# Patient Record
Sex: Female | Born: 1970 | Race: Black or African American | Hispanic: No | Marital: Married | Smoking: Current every day smoker
Health system: Southern US, Community
[De-identification: ages and names within clinical notes are randomized; demographics above are authoritative.]

## PROBLEM LIST (undated history)

## (undated) DIAGNOSIS — F32A Depression, unspecified: Secondary | ICD-10-CM

## (undated) DIAGNOSIS — F419 Anxiety disorder, unspecified: Secondary | ICD-10-CM

## (undated) DIAGNOSIS — D649 Anemia, unspecified: Secondary | ICD-10-CM

## (undated) DIAGNOSIS — I10 Essential (primary) hypertension: Secondary | ICD-10-CM

## (undated) DIAGNOSIS — E119 Type 2 diabetes mellitus without complications: Secondary | ICD-10-CM

## (undated) DIAGNOSIS — F4310 Post-traumatic stress disorder, unspecified: Secondary | ICD-10-CM

## (undated) DIAGNOSIS — F3110 Bipolar disorder, current episode manic without psychotic features, unspecified: Secondary | ICD-10-CM

## (undated) DIAGNOSIS — E785 Hyperlipidemia, unspecified: Secondary | ICD-10-CM

## (undated) HISTORY — DX: Bipolar disorder, current episode manic without psychotic features, unspecified: F31.10

## (undated) HISTORY — DX: Post-traumatic stress disorder, unspecified: F43.10

## (undated) HISTORY — PX: TUBAL LIGATION: SHX77

## (undated) HISTORY — DX: Type 2 diabetes mellitus without complications: E11.9

---

## 2013-02-24 ENCOUNTER — Encounter (HOSPITAL_COMMUNITY): Payer: Self-pay | Admitting: Emergency Medicine

## 2013-02-24 ENCOUNTER — Emergency Department (HOSPITAL_COMMUNITY): Payer: Medicaid Other

## 2013-02-24 ENCOUNTER — Emergency Department (HOSPITAL_COMMUNITY)
Admission: EM | Admit: 2013-02-24 | Discharge: 2013-02-24 | Disposition: A | Discharge status: Home / Self Care | Payer: Medicaid Other | Attending: Emergency Medicine | Admitting: Emergency Medicine

## 2013-02-24 DIAGNOSIS — R112 Nausea with vomiting, unspecified: Secondary | ICD-10-CM | POA: Insufficient documentation

## 2013-02-24 DIAGNOSIS — K5900 Constipation, unspecified: Secondary | ICD-10-CM | POA: Insufficient documentation

## 2013-02-24 DIAGNOSIS — Z9851 Tubal ligation status: Secondary | ICD-10-CM | POA: Insufficient documentation

## 2013-02-24 DIAGNOSIS — Z3202 Encounter for pregnancy test, result negative: Secondary | ICD-10-CM | POA: Insufficient documentation

## 2013-02-24 DIAGNOSIS — Z79899 Other long term (current) drug therapy: Secondary | ICD-10-CM | POA: Insufficient documentation

## 2013-02-24 DIAGNOSIS — R1084 Generalized abdominal pain: Secondary | ICD-10-CM | POA: Insufficient documentation

## 2013-02-24 DIAGNOSIS — K219 Gastro-esophageal reflux disease without esophagitis: Secondary | ICD-10-CM | POA: Insufficient documentation

## 2013-02-24 LAB — COMPREHENSIVE METABOLIC PANEL
ALT: 11 U/L (ref 0–35)
AST: 13 U/L (ref 0–37)
Alkaline Phosphatase: 88 U/L (ref 39–117)
CO2: 21 mEq/L (ref 19–32)
Calcium: 8.8 mg/dL (ref 8.4–10.5)
Chloride: 107 mEq/L (ref 96–112)
GFR calc Af Amer: >90 mL/min (ref >90)
GFR calc non Af Amer: >90 mL/min (ref >90)
Glucose, Bld: 100 mg/dL — ABNORMAL HIGH (ref 70–99)
Sodium: 139 mEq/L (ref 135–145)
Total Bilirubin: 0.2 mg/dL — ABNORMAL LOW (ref 0.3–1.2)

## 2013-02-24 LAB — CBC WITH DIFFERENTIAL/PLATELET
Basophils Absolute: 0 10*3/uL (ref 0.0–0.1)
Eosinophils Relative: 3 % (ref 0–5)
HCT: 36.4 % (ref 36.0–46.0)
Lymphocytes Relative: 43 % (ref 12–46)
Lymphs Abs: 3.8 10*3/uL (ref 0.7–4.0)
MCV: 84.1 fL (ref 78.0–100.0)
Neutro Abs: 4.2 10*3/uL (ref 1.7–7.7)
Platelets: 439 10*3/uL — ABNORMAL HIGH (ref 150–400)
RBC: 4.33 MIL/uL (ref 3.87–5.11)
RDW: 16.3 % — ABNORMAL HIGH (ref 11.5–15.5)
WBC: 8.7 10*3/uL (ref 4.0–10.5)

## 2013-02-24 LAB — URINALYSIS, ROUTINE W REFLEX MICROSCOPIC
Bilirubin Urine: NEGATIVE
Glucose, UA: NEGATIVE mg/dL
Hgb urine dipstick: NEGATIVE
Ketones, ur: NEGATIVE mg/dL
Protein, ur: NEGATIVE mg/dL
Urobilinogen, UA: 1 mg/dL (ref 0.0–1.0)

## 2013-02-24 LAB — PREGNANCY, URINE: Preg Test, Ur: NEGATIVE

## 2013-02-24 MED ORDER — OMEPRAZOLE 20 MG PO CPDR: 20.0000 mg | DELAYED_RELEASE_CAPSULE | Freq: Every day | ORAL | Status: DC

## 2013-02-24 MED ORDER — ONDANSETRON HCL 4 MG/2ML IJ SOLN
4.0000 mg | Freq: Once | INTRAMUSCULAR | Status: AC
  Administered 2013-02-24: 4 mg via INTRAVENOUS
  Filled 2013-02-24: qty 2

## 2013-02-24 MED ORDER — PROMETHAZINE HCL 25 MG PO TABS: 25.0000 mg | ORAL_TABLET | Freq: Four times a day (QID) | ORAL | Status: DC | PRN

## 2013-02-24 MED ORDER — KETOROLAC TROMETHAMINE 30 MG/ML IJ SOLN
30.0000 mg | Freq: Once | INTRAMUSCULAR | Status: AC
  Administered 2013-02-24: 30 mg via INTRAVENOUS
  Filled 2013-02-24: qty 1

## 2013-02-24 MED ORDER — SODIUM CHLORIDE 0.9 % IV BOLUS (SEPSIS)
1000.0000 mL | Freq: Once | INTRAVENOUS | Status: AC
  Administered 2013-02-24: 1000 mL via INTRAVENOUS

## 2013-02-24 MED ORDER — GI COCKTAIL ~~LOC~~
30.0000 mL | Freq: Once | ORAL | Status: AC
  Administered 2013-02-24: 30 mL via ORAL
  Filled 2013-02-24: qty 30

## 2013-02-24 MED ORDER — FLEET ENEMA 7-19 GM/118ML RE ENEM
1.0000 | ENEMA | Freq: Once | RECTAL | Status: AC
  Administered 2013-02-24: 1 via RECTAL
  Filled 2013-02-24: qty 1

## 2013-02-24 NOTE — ED Provider Notes (Signed)
History     CSN: [id number]  Arrival date & time 02/24/13  0941   First MD Initiated Contact with Patient 02/24/13 0945      Chief Complaint  Patient presents with  . Abdominal Pain  . Gastrophageal Reflux    (Consider location/radiation/quality/duration/timing/severity/associated sxs/prior treatment) The history is provided by the patient.  Meris Reede is a 42 y.o. female hx of reflux here with ab pain. Diffuse abdominal pain worse in the epigastric area for the last 2 days. It's crampy and intermittent and worse with food and laying down. She said it feels like her reflux but she took some Pepcid without any relief. She never had endoscopy in the past. Has some nausea and several episodes of vomiting and now is afraid to eat. She's been constipated for 3 days and had only hard stool. No urinary symptoms and denies being pregnant. She has a history of tubal ligation in the past.   History reviewed. No pertinent past medical history.  History reviewed. No pertinent past surgical history.  No family history on file.  History  Substance Use Topics  . Smoking status: Not on file  . Smokeless tobacco: Not on file  . Alcohol Use: Not on file    OB History   Grav Para Term Preterm Abortions TAB SAB Ect Mult Living                  Review of Systems  Gastrointestinal: Positive for nausea, vomiting, abdominal pain and constipation.  All other systems reviewed and are negative.    Allergies  Review of patient's allergies indicates no known allergies.  Home Medications   Current Outpatient Rx  Name  Route  Sig  Dispense  Refill  . famotidine (PEPCID) 20 MG tablet   Oral   Take 20 mg by mouth 2 (two) times daily.           BP 137/68  Pulse 83  Temp(Src) 98.9 F (37.2 C) (Oral)  Resp 20  SpO2 93%  LMP 02/18/2013  Physical Exam  Nursing note and vitals reviewed. Constitutional: She is oriented to person, place, and time. She appears well-developed  and well-nourished.  Uncomfortable   HENT:  Head: Normocephalic.  MM slightly dry   Eyes: Conjunctivae are normal. Pupils are equal, round, and reactive to light.  Neck: Normal range of motion. Neck supple.  Cardiovascular: Normal rate, regular rhythm and normal heart sounds.   Pulmonary/Chest: Effort normal and breath sounds normal. No respiratory distress. She has no wheezes. She has no rales.  Abdominal: Soft.  Mild diffuse tenderness, no rebound. Mild R CVAT. Rectal- minimal stool at vault, not impacted   Musculoskeletal: Normal range of motion. She exhibits no edema and no tenderness.  Neurological: She is alert and oriented to person, place, and time.  Skin: Skin is warm and dry.  Psychiatric: She has a normal mood and affect. Her behavior is normal. Judgment and thought content normal.    ED Course  Procedures (including critical care time)  Labs Reviewed  CBC WITH DIFFERENTIAL - Abnormal; Notable for the following:    RDW 16.3 (*)    Platelets 439 (*)    All other components within normal limits  COMPREHENSIVE METABOLIC PANEL - Abnormal; Notable for the following:    Glucose, Bld 100 (*)    Total Bilirubin 0.2 (*)    All other components within normal limits  URINALYSIS, ROUTINE W REFLEX MICROSCOPIC - Abnormal; Notable for the following:  Specific Gravity, Urine 1.035 (*)    All other components within normal limits  LIPASE, BLOOD  PREGNANCY, URINE  TROPONIN I   Dg Abd Acute W/chest  02/24/2013  *RADIOLOGY REPORT*  Clinical Data: Abdominal pain.  Gastroesophageal reflux.  Nausea. Pain radiating into the shoulder.  ACUTE ABDOMEN SERIES (ABDOMEN 2 VIEW & CHEST 1 VIEW)  Comparison: None.  Findings:  Cardiopericardial silhouette within normal limits. Mediastinal contours normal. Trachea midline.  No airspace disease or effusion.  No free air underneath the hemidiaphragm.  Normal bowel gas pattern is present.  Moderate to large stool burden is present.  Stool and bowel gas  present in the region of the rectosigmoid. Phleboliths noted.  IMPRESSION: Nonobstructive bowel gas pattern with moderate to large stool burden.   Original Report Authenticated By: Andreas Newport, M.D.      No diagnosis found.   Date: 02/24/2013  Rate: 66  Rhythm: normal sinus rhythm  QRS Axis: normal  Intervals: normal  ST/T Wave abnormalities: normal  Conduction Disutrbances:none  Narrative Interpretation:   Old EKG Reviewed: none available    MDM  Chaya Dollar-Bailey is a 42 y.o. female here with ab pain, vomiting. Will need to r/o pancreatitis vs worsening GERD vs SBO vs pyelo. Low risk for ACS and symptoms for several days so will get screening EKG and trop x 1. Will get labs, xray abd, give pain meds and reassess.    11:49 AM Xrays showed constipation. Lipase, UA nl. Patient hydrated and given zofran. Also given enema and had small bowel movement. I think her symptoms are likely from constipation causing worsening reflux. Will put her on prilosec in addition to pepcid. Recommend miralax for constipation and GI f/u.        Richardean Canal, MD 02/24/13 1150

## 2013-02-24 NOTE — ED Notes (Signed)
Patient transported to X-ray 

## 2013-02-24 NOTE — ED Notes (Signed)
Pt c/o of abd pain x2 days. Also c/o of GERD, hx of. Denies v/d.

## 2013-05-10 ENCOUNTER — Emergency Department (HOSPITAL_COMMUNITY): Payer: Medicaid Other

## 2013-05-10 ENCOUNTER — Encounter (HOSPITAL_COMMUNITY): Payer: Self-pay | Admitting: Emergency Medicine

## 2013-05-10 ENCOUNTER — Emergency Department (HOSPITAL_COMMUNITY)
Admission: EM | Admit: 2013-05-10 | Discharge: 2013-05-10 | Disposition: A | Discharge status: Home / Self Care | Payer: Medicaid Other | Attending: Emergency Medicine | Admitting: Emergency Medicine

## 2013-05-10 DIAGNOSIS — M79671 Pain in right foot: Secondary | ICD-10-CM

## 2013-05-10 DIAGNOSIS — M79609 Pain in unspecified limb: Secondary | ICD-10-CM | POA: Insufficient documentation

## 2013-05-10 MED ORDER — TRAMADOL HCL 50 MG PO TABS: 50.0000 mg | ORAL_TABLET | Freq: Four times a day (QID) | ORAL | Status: DC | PRN

## 2013-05-10 MED ORDER — TRAMADOL HCL 50 MG PO TABS
50.0000 mg | ORAL_TABLET | Freq: Once | ORAL | Status: AC
  Administered 2013-05-10: 50 mg via ORAL
  Filled 2013-05-10: qty 1

## 2013-05-10 NOTE — ED Provider Notes (Signed)
This chart was scribed for Danne Harbor, a non-physician practitioner working with Juliet Rude. Rubin Payor, MD by Lewanda Rife, ED Scribe. This patient was seen in room TR08C/TR08C and the patient's care was started at 1735.    History    CSN: [id number] Arrival date & time 05/10/13  1419  First MD Initiated Contact with Patient 05/10/13 1722     Chief Complaint  Patient presents with  . Foot Pain  . Knee Pain   (Consider location/radiation/quality/duration/timing/severity/associated sxs/prior Treatment) The history is provided by the patient.   HPI Comments: Stacy Cooper is a 42 y.o. female who presents to the Emergency Department complaining of constant worsening right foot pain on heel and medial aspect onset 1 year. Describes pain as non-radiating. Denies associated injury, any other pain, and swelling. Reports pain is aggravated after walking for a long period of time and alleviated at rest. Denies taking any pain medication PTA to relieve symptoms. Reports seeing a PCP with dx of plantar fasciitis. Reports having podiatrist appointment tomorrow.   History reviewed. No pertinent past medical history. History reviewed. No pertinent past surgical history. History reviewed. No pertinent family history. History  Substance Use Topics  . Smoking status: Not on file  . Smokeless tobacco: Not on file  . Alcohol Use: Not on file   OB History   Grav Para Term Preterm Abortions TAB SAB Ect Mult Living                 Review of Systems  Constitutional: Negative for fever.  Musculoskeletal: Positive for myalgias (right foot pain ).  Psychiatric/Behavioral: Negative for confusion.  All other systems reviewed and are negative.    Allergies  Prednisone  Home Medications   Current Outpatient Rx  Name  Route  Sig  Dispense  Refill  . omeprazole (PRILOSEC) 20 MG capsule   Oral   Take 1 capsule (20 mg total) by mouth daily.   20 capsule   0    BP 134/74   Pulse 96  Temp(Src) 98.4 F (36.9 C) (Oral)  Resp 18  SpO2 100% Physical Exam  Nursing note and vitals reviewed. Constitutional: She is oriented to person, place, and time. She appears well-developed and well-nourished. No distress.  HENT:  Head: Normocephalic and atraumatic.  Eyes: EOM are normal.  Neck: Neck supple. No tracheal deviation present.  Cardiovascular: Intact distal pulses.   Pulses:      Dorsalis pedis pulses are 2+ on the right side.  Pulmonary/Chest: Effort normal. No respiratory distress.  Musculoskeletal: Normal range of motion. She exhibits tenderness.  Tenderness to right heel and medial aspect  Right achilles tendon tenderness without laxity    Neurological: She is alert and oriented to person, place, and time.  Skin: Skin is warm and dry.  Psychiatric: She has a normal mood and affect. Her behavior is normal.    ED Course  Procedures (including critical care time) Medications  traMADol (ULTRAM) tablet 50 mg (50 mg Oral Given 05/10/13 1759)  Dg Foot Complete Right  05/10/2013   *RADIOLOGY REPORT*  Clinical Data: Foot pain, ankle pain, no known injury  RIGHT FOOT COMPLETE - 3+ VIEW  Comparison: None.  Findings: Three views of the right foot submitted.  No acute fracture or subluxation.  There is small plantar and posterior spur of the calcaneus.  IMPRESSION: No acute fracture or subluxation.  Small plantar and posterior spur of the calcaneus.   Original Report Authenticated By: Natasha Mead, M.D.  Labs Reviewed - No data to display No results found. No diagnosis found. 1. Right foot pain MDM  Uncomplicated foot pain without objective finding.     I personally performed the services described in this documentation, which was scribed in my presence. The recorded information has been reviewed and is accurate.     Arnoldo Hooker, PA-C 05/10/13 1828

## 2013-05-10 NOTE — ED Notes (Signed)
Pt c/o right foot pain and right knee pain x several weeks; pt sts seen PCP and told needs to see podiatrist

## 2013-05-11 NOTE — ED Provider Notes (Signed)
Medical screening examination/treatment/procedure(s) were performed by non-physician practitioner and as supervising physician I was immediately available for consultation/collaboration.  Gail Creekmore R. Bruin Bolger, MD 05/11/13 0017 

## 2013-10-18 ENCOUNTER — Encounter (HOSPITAL_COMMUNITY): Payer: Self-pay | Admitting: Emergency Medicine

## 2013-10-18 ENCOUNTER — Emergency Department (HOSPITAL_COMMUNITY)
Admission: EM | Admit: 2013-10-18 | Discharge: 2013-10-18 | Disposition: A | Discharge status: Home / Self Care | Payer: Medicaid Other | Attending: Emergency Medicine | Admitting: Emergency Medicine

## 2013-10-18 DIAGNOSIS — Z888 Allergy status to other drugs, medicaments and biological substances status: Secondary | ICD-10-CM | POA: Insufficient documentation

## 2013-10-18 DIAGNOSIS — M436 Torticollis: Secondary | ICD-10-CM | POA: Insufficient documentation

## 2013-10-18 DIAGNOSIS — G243 Spasmodic torticollis: Secondary | ICD-10-CM

## 2013-10-18 DIAGNOSIS — M25519 Pain in unspecified shoulder: Secondary | ICD-10-CM | POA: Insufficient documentation

## 2013-10-18 DIAGNOSIS — IMO0001 Reserved for inherently not codable concepts without codable children: Secondary | ICD-10-CM | POA: Insufficient documentation

## 2013-10-18 DIAGNOSIS — M79609 Pain in unspecified limb: Secondary | ICD-10-CM | POA: Insufficient documentation

## 2013-10-18 DIAGNOSIS — Z79899 Other long term (current) drug therapy: Secondary | ICD-10-CM | POA: Insufficient documentation

## 2013-10-18 DIAGNOSIS — F17200 Nicotine dependence, unspecified, uncomplicated: Secondary | ICD-10-CM | POA: Insufficient documentation

## 2013-10-18 MED ORDER — DIAZEPAM 5 MG PO TABS
5.0000 mg | ORAL_TABLET | Freq: Once | ORAL | Status: AC
Start: 2013-10-18 — End: 2013-10-18
  Administered 2013-10-18: 5 mg via ORAL
  Filled 2013-10-18: qty 1

## 2013-10-18 MED ORDER — DIAZEPAM 5 MG PO TABS: 5.0000 mg | ORAL_TABLET | Freq: Two times a day (BID) | ORAL | Status: DC

## 2013-10-18 MED ORDER — ACETAMINOPHEN 325 MG PO TABS
650.0000 mg | ORAL_TABLET | Freq: Once | ORAL | Status: AC
  Administered 2013-10-18: 650 mg via ORAL
  Filled 2013-10-18: qty 2

## 2013-10-18 MED ORDER — IBUPROFEN 800 MG PO TABS: 800.0000 mg | ORAL_TABLET | Freq: Three times a day (TID) | ORAL | Status: DC

## 2013-10-18 NOTE — ED Provider Notes (Signed)
CSN: [id number]     Arrival date & time 10/18/13  1957 History  This chart was scribed for non-physician practitioner, Francee Piccolo, PA-C working with Junius Argyle, MD, by Andrew Au, ED Scribe. This patient was seen in room TR05C/TR05C and the patient's care was started at 10:31 PM.     Chief Complaint  Patient presents with  . Neck Pain  . Arm Pain  . Shoulder Pain    The history is provided by the patient. No language interpreter was used.   HPI Comments: Stacy Cooper is a 42 y.o. female who presents to the Emergency Department complaining of 1.5 days of constant, gradually worsening neck pain that radiates to down the left arm. She describes this pain as "nagging" and rates this pain as 10/10. She denies any recent falls or injuries. She has taken 4 aleve without relief. She states any movements worsen this pain. She denies fever, visual disturbances.   History reviewed. No pertinent past medical history. History reviewed. No pertinent past surgical history. No family history on file. History  Substance Use Topics  . Smoking status: Current Every Day Smoker  . Smokeless tobacco: Not on file  . Alcohol Use: No   OB History   Grav Para Term Preterm Abortions TAB SAB Ect Mult Living                 Review of Systems  Constitutional: Negative for fever and chills.  Eyes: Negative for visual disturbance.  Respiratory: Negative for shortness of breath.   Cardiovascular: Negative for chest pain.  Musculoskeletal: Positive for arthralgias ( left shoulder and arm pain) and myalgias. Negative for back pain and neck stiffness.  Skin: Negative.   Neurological: Negative for syncope and headaches.    Allergies  Shellfish allergy and Prednisone  Home Medications   Current Outpatient Rx  Name  Route  Sig  Dispense  Refill  . naproxen sodium (ANAPROX) 220 MG tablet   Oral   Take 440 mg by mouth 2 (two) times daily with a meal.         . diazepam  (VALIUM) 5 MG tablet   Oral   Take 1 tablet (5 mg total) by mouth 2 (two) times daily.   10 tablet   0   . ibuprofen (ADVIL,MOTRIN) 800 MG tablet   Oral   Take 1 tablet (800 mg total) by mouth 3 (three) times daily.   21 tablet   0    BP 129/47  Pulse 72  Temp(Src) 99.3 F (37.4 C) (Oral)  Resp 18  SpO2 99%  LMP 09/27/2013 Physical Exam  Nursing note and vitals reviewed. Constitutional: She is oriented to person, place, and time. She appears well-developed and well-nourished. No distress.  HENT:  Head: Normocephalic and atraumatic.  Right Ear: External ear normal.  Left Ear: External ear normal.  Nose: Nose normal.  Mouth/Throat: Oropharynx is clear and moist. No oropharyngeal exudate.  Eyes: Conjunctivae and EOM are normal. Pupils are equal, round, and reactive to light.  Neck: Normal range of motion. Neck supple. Muscular tenderness present. No spinous process tenderness present. No rigidity. No edema, no erythema and normal range of motion present.    Cardiovascular: Normal rate, regular rhythm, normal heart sounds and intact distal pulses.   Pulmonary/Chest: Effort normal and breath sounds normal. No respiratory distress.  Abdominal: Soft. There is no tenderness.  Musculoskeletal: Normal range of motion.       Right shoulder: Normal.  Left shoulder: Normal.  Neurological: She is alert and oriented to person, place, and time. She has normal strength. No cranial nerve deficit or sensory deficit. Gait normal. GCS eye subscore is 4. GCS verbal subscore is 5. GCS motor subscore is 6.  No pronator drift. Bilateral heel-knee-shin intact.  Skin: Skin is warm and dry. She is not diaphoretic.  Psychiatric: She has a normal mood and affect.    ED Course  Procedures  DIAGNOSTIC STUDIES: Oxygen Saturation is 99% on RA, normal by my interpretation.    COORDINATION OF CARE: 10:00 PM-Will prescribe motrin and valium. Discussed treatment plan with pt at bedside and pt  agreed to plan.   Medications  acetaminophen (TYLENOL) tablet 650 mg (650 mg Oral Given 10/18/13 2145)  diazepam (VALIUM) tablet 5 mg (5 mg Oral Given 10/18/13 2255)    Labs Review Labs Reviewed - No data to display Imaging Review No results found.  EKG Interpretation   None       MDM   1. Torticollis, spasmodic     Patient did not meet NEXUS C-spine x-ray criteria. The patient had no posterior midline C-spine tenderness. Patient had no evidence of intoxication. Patient had normal level of altertness with GSC >14. Patient had no complaint or physical exam finding for focal neurological deficit. Patient had no distracting injury.    Afebrile, NAD, non-toxic appearing, AAOx4. No neurofocal deficits. No cervical spinous process tenderness. Pt w/ left sided cervical muscle spasm. Will treat with muscle relaxants. Return precautions discussed. Patient is agreeable to plan. Patient is stable at time of discharge      I personally performed the services described in this documentation, which was scribed in my presence. The recorded information has been reviewed and is accurate.    Jeannetta Ellis, PA-C 10/18/13 2341

## 2013-10-18 NOTE — ED Notes (Signed)
Discharge instructions reviewed with pt. Pt verbalized understanding.   

## 2013-10-18 NOTE — ED Notes (Signed)
Pt c/o left sided neck, shoulder and arm pain that started yesterday, went away and then came back today. Pt denies any injury, states she has never had pain like this before. Pt rates pain 10/10. Pt states she has had 4 aleve earlier today with no relief.

## 2013-10-18 NOTE — ED Notes (Signed)
C/o L neck pain, also L shoulder and arm pain, onset yesterday, no relief with aleve, took 2 aleve at 1400 and 2 aleve at 1600. No h/o same, worse with movement, CMS intact. Guarding movements. Alert, NAD, calm, ambulatory with slow steady careful gait. Looks uncomfortable. LS CTA. No recent travel.

## 2013-10-19 NOTE — ED Provider Notes (Signed)
Medical screening examination/treatment/procedure(s) were performed by non-physician practitioner and as supervising physician I was immediately available for consultation/collaboration.  EKG Interpretation   None         Junius Argyle, MD 10/19/13 1314

## 2015-02-23 ENCOUNTER — Other Ambulatory Visit: Payer: Self-pay | Admitting: Internal Medicine

## 2015-02-23 DIAGNOSIS — N920 Excessive and frequent menstruation with regular cycle: Secondary | ICD-10-CM

## 2015-02-23 DIAGNOSIS — N946 Dysmenorrhea, unspecified: Secondary | ICD-10-CM

## 2015-02-28 ENCOUNTER — Other Ambulatory Visit: Payer: Medicaid Other

## 2015-03-06 ENCOUNTER — Other Ambulatory Visit: Payer: Medicaid Other

## 2015-03-06 ENCOUNTER — Inpatient Hospital Stay: Admission: RE | Admit: 2015-03-06 | Payer: Medicaid Other | Source: Ambulatory Visit

## 2015-04-12 ENCOUNTER — Other Ambulatory Visit: Payer: Medicaid Other

## 2015-10-18 ENCOUNTER — Emergency Department (HOSPITAL_COMMUNITY)
Admission: EM | Admit: 2015-10-18 | Discharge: 2015-10-18 | Disposition: A | Discharge status: Home / Self Care | Payer: Medicaid Other | Attending: Emergency Medicine | Admitting: Emergency Medicine

## 2015-10-18 ENCOUNTER — Encounter (HOSPITAL_COMMUNITY): Payer: Self-pay

## 2015-10-18 ENCOUNTER — Emergency Department (HOSPITAL_COMMUNITY): Payer: Medicaid Other

## 2015-10-18 DIAGNOSIS — J3489 Other specified disorders of nose and nasal sinuses: Secondary | ICD-10-CM | POA: Insufficient documentation

## 2015-10-18 DIAGNOSIS — Z791 Long term (current) use of non-steroidal anti-inflammatories (NSAID): Secondary | ICD-10-CM | POA: Diagnosis not present

## 2015-10-18 DIAGNOSIS — Z79899 Other long term (current) drug therapy: Secondary | ICD-10-CM | POA: Insufficient documentation

## 2015-10-18 DIAGNOSIS — F17210 Nicotine dependence, cigarettes, uncomplicated: Secondary | ICD-10-CM | POA: Diagnosis not present

## 2015-10-18 DIAGNOSIS — R05 Cough: Secondary | ICD-10-CM | POA: Diagnosis not present

## 2015-10-18 DIAGNOSIS — R059 Cough, unspecified: Secondary | ICD-10-CM

## 2015-10-18 MED ORDER — AZITHROMYCIN 250 MG PO TABS: 250.0000 mg | ORAL_TABLET | Freq: Every day | ORAL | Status: DC

## 2015-10-18 NOTE — ED Notes (Signed)
Pt c/o increasing cough, mid back pain w/ coughing, and congestion x 2 weeks.  Pain score 7/10.  Pt reports taking OTC medications w/o relief.

## 2015-10-18 NOTE — Discharge Instructions (Signed)

## 2015-10-18 NOTE — ED Provider Notes (Signed)
CSN: [id number]     Arrival date & time 10/18/15  1012 History   First MD Initiated Contact with Patient 10/18/15 1101     Chief Complaint  Patient presents with  . Cough  . Nasal Congestion     (Consider location/radiation/quality/duration/timing/severity/associated sxs/prior Treatment) HPI Comments: Pt c/o cough and congestion for 2 weeks. No fever. She states that she is now having some back pain from coughing. Has been taking mucinex without relief. No neck pain or stiffness. No nausea, vomiting or diarrhea.  The history is provided by the patient. No language interpreter was used.    History reviewed. No pertinent past medical history. Past Surgical History  Procedure Laterality Date  . Cesarean section    . Tubal ligation     History reviewed. No pertinent family history. Social History  Substance Use Topics  . Smoking status: Current Every Day Smoker -- 0.25 packs/day    Types: Cigarettes  . Smokeless tobacco: None  . Alcohol Use: No   OB History    No data available     Review of Systems  All other systems reviewed and are negative.     Allergies  Shellfish allergy and Prednisone  Home Medications   Prior to Admission medications   Medication Sig Start Date End Date Taking? Authorizing Provider  diazepam (VALIUM) 5 MG tablet Take 1 tablet (5 mg total) by mouth 2 (two) times daily. 10/18/13   Jennifer Piepenbrink, PA-C  ibuprofen (ADVIL,MOTRIN) 800 MG tablet Take 1 tablet (800 mg total) by mouth 3 (three) times daily. 10/18/13   Jennifer Piepenbrink, PA-C  naproxen sodium (ANAPROX) 220 MG tablet Take 440 mg by mouth 2 (two) times daily with a meal.    Historical Provider, MD   BP 99/87 mmHg  Pulse 65  Temp(Src) 98.2 F (36.8 C) (Oral)  Resp 16  SpO2 100%  LMP 10/11/2015 Physical Exam  Constitutional: She is oriented to person, place, and time. She appears well-developed and well-nourished.  HENT:  Right Ear: External ear normal.  Left Ear: External  ear normal.  Nose: Rhinorrhea present.  Cardiovascular: Normal rate and regular rhythm.   Pulmonary/Chest: Effort normal and breath sounds normal.  Abdominal: Soft. Bowel sounds are normal. There is no tenderness.  Musculoskeletal: Normal range of motion.  Neurological: She is alert and oriented to person, place, and time.  Skin: Skin is warm.  Psychiatric: She has a normal mood and affect.  Nursing note and vitals reviewed.   ED Course  Procedures (including critical care time) Labs Review Labs Reviewed - No data to display  Imaging Review Dg Chest 2 View  10/18/2015  CLINICAL DATA:  Cough and congestion EXAM: CHEST  2 VIEW COMPARISON:  02/24/2013 FINDINGS: The heart size and mediastinal contours are within normal limits. Both lungs are clear. The visualized skeletal structures are unremarkable. IMPRESSION: No active cardiopulmonary disease. Electronically Signed   By: Marlan Palauharles  Clark M.D.   On: 10/18/2015 11:40   I have personally reviewed and evaluated these images and lab results as part of my medical decision-making.   EKG Interpretation None      MDM   Final diagnoses:  Cough    No pneumonia noted on x-ray. Will treat pt with zithromax as having continued symptoms    Teressa LowerVrinda Abbye Lao, NP 10/18/15 1148  Mancel BaleElliott Wentz, MD 10/18/15 [phone]

## 2016-08-22 LAB — GLUCOSE, POCT (MANUAL RESULT ENTRY): POC Glucose: 168 mg/dl — AB (ref 70–99)

## 2016-09-19 NOTE — Congregational Nurse Program (Signed)
Congregational Nurse Program Note  Date of Encounter: 08/22/2016  Past Medical History: No past medical history on file.  Encounter Details:     CNP Questionnaire - 08/22/16 2344      Patient Demographics   Is this a new or existing patient? New   Patient is considered a/an Not Applicable   Race African-American/Black     Patient Assistance   Location of Patient [facility]   Patient's financial/insurance status Medicaid   Uninsured Patient ([place] [facility]/[employer]) No   Patient referred to apply for the following financial assistance Not Applicable   Food insecurities addressed Not Applicable   Transportation assistance No   Assistance securing medications No   Educational health offerings Diabetes     Encounter Details   Primary purpose of visit Acute Illness/Condition Visit;Education/Health Concerns   Was an Emergency Department visit averted? No   Does patient have a medical provider? Yes   Patient referred to Follow up with established PCP   Was a mental health screening completed? (GAINS tool) No   Does patient have dental issues? No   Does patient have vision issues? No   Does your patient have an abnormal blood pressure today? No   Since previous encounter, have you referred patient for abnormal blood pressure that resulted in a new diagnosis or medication change? No   Does your patient have an abnormal blood glucose today? Yes   Since previous encounter, have you referred patient for abnormal blood glucose that resulted in a new diagnosis or medication change? No   Was there a life-saving intervention made? No      Client is diabetic and on meds.  She had blood sugar of 168 and reported she only had coffee.  She will return for bs next week.

## 2016-09-19 NOTE — Congregational Nurse Program (Signed)
Congregational Nurse Program Note  Date of Encounter: 09/05/2016  Past Medical History: No past medical history on file.  Encounter Details:     CNP Questionnaire - 09/05/16 2358      Patient Demographics   Is this a new or existing patient? Existing   Patient is considered a/an Not Applicable   Race African-American/Black     Patient Assistance   Location of Patient [facility]   Patient's financial/insurance status Medicaid   Uninsured Patient ([place] [facility]/[employer]s) No   Patient referred to apply for the following financial assistance Not Applicable   Food insecurities addressed Not Applicable   Transportation assistance No   Assistance securing medications No   Educational health offerings Diabetes     Encounter Details   Primary purpose of visit Acute Illness/Condition Visit;Education/Health Concerns   Was an Emergency Department visit averted? No   Does patient have a medical provider? Yes   Patient referred to Follow up with established PCP   Was a mental health screening completed? (GAINS tool) No   Does patient have dental issues? No   Does patient have vision issues? No   Does your patient have an abnormal blood pressure today? No   Since previous encounter, have you referred patient for abnormal blood pressure that resulted in a new diagnosis or medication change? No   Does your patient have an abnormal blood glucose today? Yes   Since previous encounter, have you referred patient for abnormal blood glucose that resulted in a new diagnosis or medication change? No   Was there a life-saving intervention made? No      Client is diabetic and on meds.  She had blood sugar of 140 today and reported she only had coffee this am.   She will return for bs next week.

## 2017-10-23 ENCOUNTER — Emergency Department (HOSPITAL_COMMUNITY): Payer: Self-pay

## 2017-10-23 ENCOUNTER — Encounter (HOSPITAL_COMMUNITY): Payer: Self-pay

## 2017-10-23 ENCOUNTER — Emergency Department (HOSPITAL_COMMUNITY)
Admission: EM | Admit: 2017-10-23 | Discharge: 2017-10-24 | Disposition: A | Discharge status: Home / Self Care | Payer: Self-pay | Attending: Emergency Medicine | Admitting: Emergency Medicine

## 2017-10-23 DIAGNOSIS — Z79899 Other long term (current) drug therapy: Secondary | ICD-10-CM | POA: Insufficient documentation

## 2017-10-23 DIAGNOSIS — Y999 Unspecified external cause status: Secondary | ICD-10-CM | POA: Insufficient documentation

## 2017-10-23 DIAGNOSIS — F17210 Nicotine dependence, cigarettes, uncomplicated: Secondary | ICD-10-CM | POA: Insufficient documentation

## 2017-10-23 DIAGNOSIS — Y929 Unspecified place or not applicable: Secondary | ICD-10-CM | POA: Insufficient documentation

## 2017-10-23 DIAGNOSIS — X31XXXA Exposure to excessive natural cold, initial encounter: Secondary | ICD-10-CM | POA: Insufficient documentation

## 2017-10-23 DIAGNOSIS — R1084 Generalized abdominal pain: Secondary | ICD-10-CM | POA: Insufficient documentation

## 2017-10-23 DIAGNOSIS — S60511A Abrasion of right hand, initial encounter: Secondary | ICD-10-CM | POA: Insufficient documentation

## 2017-10-23 DIAGNOSIS — Y93H1 Activity, digging, shoveling and raking: Secondary | ICD-10-CM | POA: Insufficient documentation

## 2017-10-23 DIAGNOSIS — L089 Local infection of the skin and subcutaneous tissue, unspecified: Secondary | ICD-10-CM | POA: Insufficient documentation

## 2017-10-23 DIAGNOSIS — T33521A Superficial frostbite of right hand, initial encounter: Secondary | ICD-10-CM | POA: Insufficient documentation

## 2017-10-23 DIAGNOSIS — T33522A Superficial frostbite of left hand, initial encounter: Secondary | ICD-10-CM | POA: Insufficient documentation

## 2017-10-23 HISTORY — DX: Anemia, unspecified: D64.9

## 2017-10-23 LAB — COMPREHENSIVE METABOLIC PANEL
ALBUMIN: 4.1 g/dL (ref 3.5–5.0)
ALT: 12 U/L — ABNORMAL LOW (ref 14–54)
ANION GAP: 10 (ref 5–15)
AST: 17 U/L (ref 15–41)
Alkaline Phosphatase: 82 U/L (ref 38–126)
BILIRUBIN TOTAL: 0.7 mg/dL (ref 0.3–1.2)
BUN: 12 mg/dL (ref 6–20)
CHLORIDE: 106 mmol/L (ref 101–111)
CO2: 20 mmol/L — AB (ref 22–32)
Calcium: 9.6 mg/dL (ref 8.9–10.3)
Creatinine, Ser: 0.71 mg/dL (ref 0.44–1.00)
GFR calc Af Amer: >60 mL/min (ref >60)
GFR calc non Af Amer: >60 mL/min (ref >60)
GLUCOSE: 92 mg/dL (ref 65–99)
POTASSIUM: 3.9 mmol/L (ref 3.5–5.1)
SODIUM: 136 mmol/L (ref 135–145)
Total Protein: 7.4 g/dL (ref 6.5–8.1)

## 2017-10-23 LAB — CBC
HEMATOCRIT: 35.5 % — AB (ref 36.0–46.0)
HEMOGLOBIN: 11.8 g/dL — AB (ref 12.0–15.0)
MCH: 28.9 pg (ref 26.0–34.0)
MCHC: 33.2 g/dL (ref 30.0–36.0)
MCV: 86.8 fL (ref 78.0–100.0)
Platelets: 373 10*3/uL (ref 150–400)
RBC: 4.09 MIL/uL (ref 3.87–5.11)
RDW: 15.6 % — AB (ref 11.5–15.5)
WBC: 7.5 10*3/uL (ref 4.0–10.5)

## 2017-10-23 LAB — I-STAT BETA HCG BLOOD, ED (MC, WL, AP ONLY): I-stat hCG, quantitative: <5 m[IU]/mL (ref <5)

## 2017-10-23 LAB — LIPASE, BLOOD: LIPASE: 17 U/L (ref 11–51)

## 2017-10-23 MED ORDER — ONDANSETRON HCL 4 MG/2ML IJ SOLN
4.0000 mg | Freq: Once | INTRAMUSCULAR | Status: AC
  Administered 2017-10-23: 4 mg via INTRAVENOUS
  Filled 2017-10-23: qty 2

## 2017-10-23 MED ORDER — IOPAMIDOL (ISOVUE-300) INJECTION 61%
INTRAVENOUS | Status: AC
  Administered 2017-10-23: 100 mL
  Filled 2017-10-23: qty 100

## 2017-10-23 MED ORDER — MORPHINE SULFATE (PF) 4 MG/ML IV SOLN
4.0000 mg | Freq: Once | INTRAVENOUS | Status: AC
  Administered 2017-10-23: 4 mg via INTRAVENOUS
  Filled 2017-10-23: qty 1

## 2017-10-23 MED ORDER — KETOROLAC TROMETHAMINE 15 MG/ML IJ SOLN
15.0000 mg | Freq: Once | INTRAMUSCULAR | Status: AC
  Administered 2017-10-24: 15 mg via INTRAVENOUS
  Filled 2017-10-23: qty 1

## 2017-10-23 NOTE — ED Provider Notes (Signed)
[facility] EMERGENCY DEPARTMENT Provider Note   CSN: [id number] Arrival date & time: 10/23/17  1552     History   Chief Complaint Chief Complaint  Patient presents with  . Abdominal Pain    HPI Stacy Cooper is a 46 y.o. female.  HPI  Patient comes in with chief complaint of abdominal pain, bilateral hand pain and numbness, neck pain, nausea and emesis. Patient reports that her symptoms started 4 days ago.  Symptoms started after she shoveled snow for 2 hours.  Patient started having tingling in both of her hands along with numbness extending up to the shoulders.  Patient also is experiencing some neck pain, headaches.  Headaches have been intermittent.  Patient did have gloves on she was have shoveling.  Patient denies any associated weakness in her arms, legs or any facial numbness.  Patient reports that she started also noticing abdominal discomfort in the lower part of her abdomen around the same time.  Pain is described as sharp pain which has waxing and waning intensity.  Patient has no history of similar pain in the past.  Associated symptoms include nausea with emesis and anorexia.  Patient's last bowel movement was 3 days ago, and she does not think she is passing flatus.  Patient has had history of C-section, patient denies any vaginal bleeding, discharge, UTI-like symptoms, concerns for STDs.  Past Medical History:  Diagnosis Date  . Anemia     There are no active problems to display for this patient.   Past Surgical History:  Procedure Laterality Date  . CESAREAN SECTION    . TUBAL LIGATION      OB History    No data available       Home Medications    Prior to Admission medications   Medication Sig Start Date End Date Taking? Authorizing Provider  aspirin-acetaminophen-caffeine (EXCEDRIN MIGRAINE) [phone] MG tablet Take 2 tablets by mouth every 6 (six) hours as needed for headache.   Yes [provider]    azithromycin (ZITHROMAX) 250 MG tablet Take 1 tablet (250 mg total) by mouth daily. Take first 2 tablets together, then 1 every day until finished. Patient not taking: Reported on 10/23/2017 10/18/15   Teressa Lower, NP  diazepam (VALIUM) 5 MG tablet Take 1 tablet (5 mg total) by mouth 2 (two) times daily. Patient not taking: Reported on 10/18/2015 10/18/13   Piepenbrink, Victorino Dike, PA-C  naproxen (NAPROSYN) 500 MG tablet Take 1 tablet (500 mg total) by mouth 2 (two) times daily. 10/24/17   Derwood Kaplan, MD  ondansetron (ZOFRAN ODT) 4 MG disintegrating tablet Take 1 tablet (4 mg total) by mouth every 8 (eight) hours as needed for nausea or vomiting. 10/24/17   Derwood Kaplan, MD  sulfamethoxazole-trimethoprim (BACTRIM DS,SEPTRA DS) 800-160 MG tablet Take 1 tablet by mouth 2 (two) times daily for 7 days. 10/24/17 10/31/17  Derwood Kaplan, MD    Family History History reviewed. No pertinent family history.  Social History Social History   Tobacco Use  . Smoking status: Current Every Day Smoker    Packs/day: 0.25    Types: Cigarettes  . Smokeless tobacco: Never Used  Substance Use Topics  . Alcohol use: No  . Drug use: No     Allergies   Codeine; Shellfish allergy; and Prednisone   Review of Systems Review of Systems  Constitutional: Positive for activity change.  Respiratory: Negative for shortness of breath.   Cardiovascular: Negative for chest pain.  Gastrointestinal: Positive for abdominal pain,  nausea and vomiting.  Genitourinary: Negative for pelvic pain.  Musculoskeletal: Positive for neck pain.  Neurological: Positive for numbness.  All other systems reviewed and are negative.    Physical Exam Updated Vital Signs BP (!) 168/75 (BP Location: Left Arm)   Pulse (!) 54   Temp 98.6 F (37 C) (Oral)   Resp 18   Ht 5\' 5"  (1.651 m)   LMP 10/19/2017 (Exact Date)   SpO2 100%   Physical Exam  Constitutional: She is oriented to person, place, and time. She  appears well-developed.  HENT:  Head: Normocephalic and atraumatic.  Eyes: EOM are normal.  Neck: Normal range of motion. Neck supple.  Cardiovascular: Normal rate.  Pulmonary/Chest: Effort normal.  Pt has c-spine tenderness  Abdominal: Soft. Bowel sounds are decreased. There is generalized tenderness. There is guarding. There is no rebound.  Neurological: She is alert and oriented to person, place, and time.  Skin: Skin is warm and dry.  Nursing note and vitals reviewed.    ED Treatments / Results  Labs (all labs ordered are listed, but only abnormal results are displayed) Labs Reviewed  COMPREHENSIVE METABOLIC PANEL - Abnormal; Notable for the following components:      Result Value   CO2 20 (*)    ALT 12 (*)    All other components within normal limits  CBC - Abnormal; Notable for the following components:   Hemoglobin 11.8 (*)    HCT 35.5 (*)    RDW 15.6 (*)    All other components within normal limits  URINALYSIS, ROUTINE W REFLEX MICROSCOPIC - Abnormal; Notable for the following components:   Specific Gravity, Urine 1.036 (*)    Ketones, ur 20 (*)    All other components within normal limits  LIPASE, BLOOD  I-STAT BETA HCG BLOOD, ED ([facility], [facility], [facility] ONLY)    EKG  EKG Interpretation None       Radiology Ct Cervical Spine Wo Contrast  Result Date: 10/24/2017 CLINICAL DATA:  Acute onset of neck pain and bilateral arm numbness. EXAM: CT CERVICAL SPINE WITHOUT CONTRAST TECHNIQUE: Multidetector CT imaging of the cervical spine was performed without intravenous contrast. Multiplanar CT image reconstructions were also generated. COMPARISON:  None. FINDINGS: Alignment: Normal. Skull base and vertebrae: No acute fracture. No primary bone lesion or focal pathologic process. Soft tissues and spinal canal: No prevertebral fluid or swelling. No visible canal hematoma. There is calcification along the posterior longitudinal ligament. There appears to be underlying narrowing of the  spinal canal to 4-5 mm at C4-C5. Disc levels: Intervertebral disc spaces are preserved. The bony foramina are grossly unremarkable. Upper chest: Scattered blebs are noted at the lung apices. The thyroid gland is unremarkable. Other: No additional soft tissue abnormalities are seen. 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 IMPRESSION: 1. No evidence of fracture or subluxation along the cervical spine. 2. Calcification along the posterior longitudinal ligament. 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 MRI of the cervical spine is recommended for further evaluation, when and as deemed clinically appropriate. 3. 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 Electronically Signed   By: Roanna Raider M.D.   On: 10/24/2017 00:19   Ct Abdomen Pelvis W Contrast  Result Date: 10/24/2017 CLINICAL DATA:  Acute onset of lower abdominal pain, loss of appetite and vomiting. EXAM: CT ABDOMEN AND PELVIS WITH CONTRAST TECHNIQUE: Multidetector CT imaging of the abdomen  and pelvis was performed using the standard protocol following bolus administration of intravenous contrast. CONTRAST:  100mL ISOVUE-300 IOPAMIDOL (ISOVUE-300) INJECTION 61% COMPARISON:  Abdominal radiograph performed 02/24/2013 FINDINGS: Lower chest: The visualized lung bases are grossly clear. The visualized portions of the mediastinum are unremarkable. Hepatobiliary: The liver is unremarkable in appearance. The gallbladder is unremarkable in appearance. The common bile duct remains normal in caliber. Pancreas: The pancreas is within normal limits. Spleen: The spleen is unremarkable in appearance. Adrenals/Urinary Tract: Two right adrenal nodules are noted, the larger of which measures 1.6 cm in size. The attenuation is compatible with adrenal adenomas. The left adrenal gland is unremarkable in appearance. The kidneys  are unremarkable. There is no evidence of hydronephrosis. No renal or ureteral stones are identified. No perinephric stranding is seen. Stomach/Bowel: The stomach is unremarkable in appearance. The small bowel is within normal limits. The appendix is normal in caliber, without evidence of appendicitis. The colon is unremarkable in appearance. Vascular/Lymphatic: The abdominal aorta is unremarkable in appearance. Minimal calcification is seen along the common iliac arteries bilaterally. The inferior vena cava is grossly unremarkable. No retroperitoneal lymphadenopathy is seen. No pelvic sidewall lymphadenopathy is identified. Reproductive: The bladder is mildly distended and grossly unremarkable. The uterus is unremarkable in appearance. The ovaries are grossly symmetric. No suspicious adnexal masses are seen. Other: No additional soft tissue abnormalities are seen. Musculoskeletal: No acute osseous abnormalities are identified. The visualized musculature is unremarkable in appearance. IMPRESSION: 1. No acute abnormality seen within the abdomen or pelvis. 2. Two right adrenal adenomas noted, measuring up to 1.6 cm in size. Electronically Signed   By: Roanna RaiderJeffery  Chang M.D.   On: 10/24/2017 00:22    Procedures Procedures (including critical care time)  Medications Ordered in ED Medications  morphine 4 MG/ML injection 4 mg (4 mg Intravenous Given 10/23/17 2238)  ondansetron (ZOFRAN) injection 4 mg (4 mg Intravenous Given 10/23/17 2238)  iopamidol (ISOVUE-300) 61 % injection (100 mLs  Contrast Given 10/23/17 2312)  ketorolac (TORADOL) 15 MG/ML injection 15 mg (15 mg Intravenous Given 10/24/17 0003)  sodium chloride 0.9 % bolus 1,000 mL (0 mLs Intravenous Stopped 10/24/17 0204)     Initial Impression / Assessment and Plan / ED Course  I have reviewed the triage vital signs and the nursing notes.  Pertinent labs & imaging results that were available during my care of the patient were reviewed by me and  considered in my medical decision making (see chart for details).  Clinical Course as of Oct 24 2352  Fri Oct 24, 2017  0034 Results from the ER workup discussed with the patient face to face and all questions answered to the best of my ability.  P.o. challenge has been initiated. If patient passes her oral challenge she will be discharged with pcp f/u.  [AN]    Clinical Course User Index [AN] Derwood KaplanNanavati, Demarion Pondexter, MD   Patient comes in with chief complaint of hand pain, numbness and tingling to bilateral upper extremities, abdominal pain with nausea and vomiting. Patient has bilateral upper extremity paresthesias and numbness.  She complains of neck pain.  On exam patient does have midline C-spine tenderness.  Upper extremity strength is 4+ out of 5 and she has no evidence of hyperreflexia.  Patient's symptoms started after she was shoveling snow, and she reportedly did not have gloves on.  She also ended up having a gash to her hand while shoveling.  Based on my exam and the history provided I have suspicion for  mild frostbite.  There is focal swelling around the wound on the right hand, could be reactive effusion in that area however infection is also possible.  We will start patient on Bactrim.  If patient's symptoms does not improve then I recommend she might have ended up with mild frostbite.  We will advised that patient follow-up with primary care doctor.  CT C-spine ordered to ensure there is no severe stenosis.  Patient is also having generalized abdominal pain.  Abdominal tenderness is moderately severe, with some guarding.  CT scan ordered.  Final Clinical Impressions(s) / ED Diagnoses   Final diagnoses:  Frostbite of both hands  Infected abrasion of right hand, initial encounter  Generalized abdominal pain    ED Discharge Orders        Ordered    sulfamethoxazole-trimethoprim (BACTRIM DS,SEPTRA DS) 800-160 MG tablet  2 times daily     10/24/17 0036    naproxen (NAPROSYN) 500 MG  tablet  2 times daily     10/24/17 0036    ondansetron (ZOFRAN ODT) 4 MG disintegrating tablet  Every 8 hours PRN     10/24/17 0036       Derwood KaplanNanavati, Yohance Hathorne, MD 10/24/17 2354
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
## 2017-10-24 LAB — URINALYSIS, ROUTINE W REFLEX MICROSCOPIC
Bilirubin Urine: NEGATIVE
GLUCOSE, UA: NEGATIVE mg/dL
Hgb urine dipstick: NEGATIVE
KETONES UR: 20 mg/dL — AB
LEUKOCYTES UA: NEGATIVE
Nitrite: NEGATIVE
PROTEIN: NEGATIVE mg/dL
Specific Gravity, Urine: 1.036 — ABNORMAL HIGH (ref 1.005–1.030)
pH: 6 (ref 5.0–8.0)

## 2017-10-24 MED ORDER — NAPROXEN 500 MG PO TABS: 500.0000 mg | ORAL_TABLET | Freq: Two times a day (BID) | ORAL | 0 refills | Status: DC

## 2017-10-24 MED ORDER — ONDANSETRON 4 MG PO TBDP: 4.0000 mg | ORAL_TABLET | Freq: Three times a day (TID) | ORAL | 0 refills | Status: DC | PRN

## 2017-10-24 MED ORDER — SODIUM CHLORIDE 0.9 % IV BOLUS (SEPSIS)
1000.0000 mL | Freq: Once | INTRAVENOUS | Status: AC
  Administered 2017-10-24: 1000 mL via INTRAVENOUS

## 2017-10-24 MED ORDER — SULFAMETHOXAZOLE-TRIMETHOPRIM 800-160 MG PO TABS: 1.0000 | ORAL_TABLET | Freq: Two times a day (BID) | ORAL | 0 refills | Status: AC

## 2017-10-24 NOTE — ED Notes (Signed)
Pt given ice, ok by edp

## 2017-10-24 NOTE — Discharge Instructions (Addendum)
We suspect that the numbness is from frostbite. Take the antibiotics for the possible infection to the hand from the laceration.  CT scan of the abdomen does not show any concerning findings.  Please take clear liquid diet for the next 2 or 3 days. See your doctor in 3-5 days.

## 2017-10-24 NOTE — ED Provider Notes (Signed)
I assumed care in sign out from Dr. Rhunette CroftNanavati Plan to DC home after taking p.o. Fluids She is taking fluids without difficulty and feels improved We will discharge home He has already went over discharge instructions as well as medications and antibiotics I did inform her of the adrenal adenomas and need for outpatient follow-up and imaging for that   Zadie RhineWickline, Eusebia Grulke, MD 10/24/17 0129

## 2018-11-22 ENCOUNTER — Emergency Department (HOSPITAL_COMMUNITY)
Admission: EM | Admit: 2018-11-22 | Discharge: 2018-11-22 | Disposition: A | Discharge status: Other Acute Inpatient Hospital | Payer: Medicaid Other | Attending: Emergency Medicine | Admitting: Emergency Medicine

## 2018-11-22 ENCOUNTER — Other Ambulatory Visit: Payer: Self-pay

## 2018-11-22 ENCOUNTER — Inpatient Hospital Stay (HOSPITAL_COMMUNITY)
Admission: AD | Admit: 2018-11-22 | Discharge: 2018-11-26 | DRG: 885 | Disposition: A | Discharge status: Home / Self Care | Payer: Federal, State, Local not specified - Other | Source: Intra-hospital | Attending: Psychiatry | Admitting: Psychiatry

## 2018-11-22 ENCOUNTER — Encounter (HOSPITAL_COMMUNITY): Payer: Self-pay | Admitting: *Deleted

## 2018-11-22 DIAGNOSIS — G4700 Insomnia, unspecified: Secondary | ICD-10-CM | POA: Diagnosis present

## 2018-11-22 DIAGNOSIS — J45909 Unspecified asthma, uncomplicated: Secondary | ICD-10-CM | POA: Diagnosis present

## 2018-11-22 DIAGNOSIS — E876 Hypokalemia: Secondary | ICD-10-CM | POA: Diagnosis present

## 2018-11-22 DIAGNOSIS — Z79899 Other long term (current) drug therapy: Secondary | ICD-10-CM | POA: Insufficient documentation

## 2018-11-22 DIAGNOSIS — Z885 Allergy status to narcotic agent status: Secondary | ICD-10-CM | POA: Diagnosis not present

## 2018-11-22 DIAGNOSIS — Z7982 Long term (current) use of aspirin: Secondary | ICD-10-CM | POA: Diagnosis not present

## 2018-11-22 DIAGNOSIS — Z91013 Allergy to seafood: Secondary | ICD-10-CM | POA: Diagnosis not present

## 2018-11-22 DIAGNOSIS — F419 Anxiety disorder, unspecified: Secondary | ICD-10-CM | POA: Insufficient documentation

## 2018-11-22 DIAGNOSIS — Z9851 Tubal ligation status: Secondary | ICD-10-CM

## 2018-11-22 DIAGNOSIS — F17210 Nicotine dependence, cigarettes, uncomplicated: Secondary | ICD-10-CM | POA: Diagnosis present

## 2018-11-22 DIAGNOSIS — Z888 Allergy status to other drugs, medicaments and biological substances status: Secondary | ICD-10-CM

## 2018-11-22 DIAGNOSIS — R45850 Homicidal ideations: Secondary | ICD-10-CM | POA: Diagnosis present

## 2018-11-22 DIAGNOSIS — F322 Major depressive disorder, single episode, severe without psychotic features: Principal | ICD-10-CM | POA: Diagnosis present

## 2018-11-22 DIAGNOSIS — F329 Major depressive disorder, single episode, unspecified: Secondary | ICD-10-CM | POA: Insufficient documentation

## 2018-11-22 DIAGNOSIS — F332 Major depressive disorder, recurrent severe without psychotic features: Secondary | ICD-10-CM | POA: Diagnosis not present

## 2018-11-22 DIAGNOSIS — I10 Essential (primary) hypertension: Secondary | ICD-10-CM | POA: Diagnosis present

## 2018-11-22 DIAGNOSIS — Z9119 Patient's noncompliance with other medical treatment and regimen: Secondary | ICD-10-CM

## 2018-11-22 DIAGNOSIS — Z046 Encounter for general psychiatric examination, requested by authority: Secondary | ICD-10-CM | POA: Insufficient documentation

## 2018-11-22 DIAGNOSIS — R030 Elevated blood-pressure reading, without diagnosis of hypertension: Secondary | ICD-10-CM | POA: Diagnosis present

## 2018-11-22 DIAGNOSIS — R451 Restlessness and agitation: Secondary | ICD-10-CM | POA: Insufficient documentation

## 2018-11-22 LAB — COMPREHENSIVE METABOLIC PANEL
ALK PHOS: 67 U/L (ref 38–126)
ALT: 31 U/L (ref 0–44)
AST: 29 U/L (ref 15–41)
Albumin: 3.7 g/dL (ref 3.5–5.0)
Anion gap: 10 (ref 5–15)
BILIRUBIN TOTAL: 0.3 mg/dL (ref 0.3–1.2)
BUN: 17 mg/dL (ref 6–20)
CALCIUM: 9.2 mg/dL (ref 8.9–10.3)
CO2: 20 mmol/L — ABNORMAL LOW (ref 22–32)
CREATININE: 0.99 mg/dL (ref 0.44–1.00)
Chloride: 109 mmol/L (ref 98–111)
GFR calc Af Amer: >60 mL/min (ref >60)
Glucose, Bld: 116 mg/dL — ABNORMAL HIGH (ref 70–99)
Potassium: 3.3 mmol/L — ABNORMAL LOW (ref 3.5–5.1)
Sodium: 139 mmol/L (ref 135–145)
TOTAL PROTEIN: 7.3 g/dL (ref 6.5–8.1)

## 2018-11-22 LAB — RAPID URINE DRUG SCREEN, HOSP PERFORMED
Amphetamines: NOT DETECTED
Barbiturates: NOT DETECTED
Benzodiazepines: NOT DETECTED
Cocaine: NOT DETECTED
OPIATES: NOT DETECTED
Tetrahydrocannabinol: POSITIVE — AB

## 2018-11-22 LAB — CBC
HCT: 36.5 % (ref 36.0–46.0)
Hemoglobin: 11.7 g/dL — ABNORMAL LOW (ref 12.0–15.0)
MCH: 29 pg (ref 26.0–34.0)
MCHC: 32.1 g/dL (ref 30.0–36.0)
MCV: 90.3 fL (ref 80.0–100.0)
PLATELETS: 337 10*3/uL (ref 150–400)
RBC: 4.04 MIL/uL (ref 3.87–5.11)
RDW: 16.3 % — AB (ref 11.5–15.5)
WBC: 10.1 10*3/uL (ref 4.0–10.5)
nRBC: 0 % (ref 0.0–0.2)

## 2018-11-22 LAB — I-STAT TROPONIN, ED: TROPONIN I, POC: 0.01 ng/mL (ref 0.00–0.08)

## 2018-11-22 LAB — ETHANOL

## 2018-11-22 MED ORDER — ACETAMINOPHEN 325 MG PO TABS
650.0000 mg | ORAL_TABLET | Freq: Four times a day (QID) | ORAL | Status: DC | PRN
  Administered 2018-11-22 – 2018-11-26 (×9): 650 mg via ORAL
  Filled 2018-11-22 (×9): qty 2

## 2018-11-22 MED ORDER — ALUM & MAG HYDROXIDE-SIMETH 200-200-20 MG/5ML PO SUSP: 30.0000 mL | ORAL | Status: DC | PRN

## 2018-11-22 MED ORDER — MIRTAZAPINE 7.5 MG PO TABS
7.5000 mg | ORAL_TABLET | Freq: Every day | ORAL | Status: DC
  Administered 2018-11-22 – 2018-11-25 (×4): 7.5 mg via ORAL
  Filled 2018-11-22 (×6): qty 1
  Filled 2018-11-22: qty 7

## 2018-11-22 MED ORDER — TRAZODONE HCL 50 MG PO TABS: 50.0000 mg | ORAL_TABLET | Freq: Every evening | ORAL | Status: DC | PRN

## 2018-11-22 MED ORDER — ENSURE ENLIVE PO LIQD
237.0000 mL | Freq: Two times a day (BID) | ORAL | Status: DC
  Administered 2018-11-23 – 2018-11-26 (×4): 237 mL via ORAL

## 2018-11-22 MED ORDER — ACETAMINOPHEN 325 MG PO TABS: 650.0000 mg | ORAL_TABLET | ORAL | Status: DC | PRN

## 2018-11-22 MED ORDER — HYDROXYZINE HCL 25 MG PO TABS
25.0000 mg | ORAL_TABLET | Freq: Three times a day (TID) | ORAL | Status: DC | PRN
  Filled 2018-11-22: qty 10

## 2018-11-22 MED ORDER — ONDANSETRON HCL 4 MG PO TABS: 4.0000 mg | ORAL_TABLET | Freq: Three times a day (TID) | ORAL | Status: DC | PRN

## 2018-11-22 MED ORDER — PNEUMOCOCCAL VAC POLYVALENT 25 MCG/0.5ML IJ INJ: 0.5000 mL | INJECTION | INTRAMUSCULAR | Status: DC

## 2018-11-22 MED ORDER — MAGNESIUM HYDROXIDE 400 MG/5ML PO SUSP: 30.0000 mL | Freq: Every day | ORAL | Status: DC | PRN

## 2018-11-22 MED ORDER — POTASSIUM CHLORIDE CRYS ER 20 MEQ PO TBCR
20.0000 meq | EXTENDED_RELEASE_TABLET | Freq: Once | ORAL | Status: AC
  Administered 2018-11-22: 20 meq via ORAL
  Filled 2018-11-22: qty 1

## 2018-11-22 MED ORDER — LORAZEPAM 1 MG PO TABS
1.0000 mg | ORAL_TABLET | Freq: Four times a day (QID) | ORAL | Status: DC | PRN
  Administered 2018-11-22: 1 mg via ORAL
  Filled 2018-11-22: qty 1

## 2018-11-22 MED ORDER — POTASSIUM CHLORIDE CRYS ER 20 MEQ PO TBCR
20.0000 meq | EXTENDED_RELEASE_TABLET | Freq: Two times a day (BID) | ORAL | Status: AC
  Administered 2018-11-22 – 2018-11-23 (×2): 20 meq via ORAL
  Filled 2018-11-22 (×3): qty 1
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
## 2018-11-22 NOTE — ED Notes (Signed)
Breakfast requested 
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
## 2018-11-22 NOTE — ED Notes (Signed)
Pt voiced understanding and agreement w/tx plan - accepted to [facility] - pt signed consent form - copy faxed to [facility], copy sent to Medical Records, and original placed in envelope for [facility]
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
## 2018-11-22 NOTE — ED Notes (Signed)
Pt called someone from phone at nurses' desk to advise of tx plan [facility]. ALL belongings - 2 labeled belongings bags, 1 Valuables envelope, and Home Med - [place] - Pt aware.

## 2018-11-22 NOTE — ED Notes (Signed)
Breakfast tray ordered 
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
## 2018-11-22 NOTE — H&P (Signed)
Psychiatric Admission Assessment Adult  Patient Identification: Stacy Cooper MRN:  [id number] Date of Evaluation:  11/22/2018 Chief Complaint: " I need help for myself" Principal Diagnosis: MDD, no psychotic features. Homicidal Ideations  Diagnosis:  Active Problems:   Severe major depression, single episode, without psychotic features (HCC)  History of Present Illness: 48 year old female. Presented to ED via [employer], whom she reportedly called herself. On day of admission she had an argument with stepdaughter which escalated .Patient states she felt " like really hurting her if I could". On admission reported homicidal ideations towards stepdaughter and husband.  Reports increased depression related in part to family tension. Reports increased tension at home after an adult stepdaughter moved in several weeks ago, and feels her husband has been unsupportive, taking daughter's side rather than hers. On day of admission she had an argument with stepdaughter which escalated . In addition to anger towards husband, stepdaughter also endorses depression, sadness, but denies any suicidal ideations. Endorses neuro-vegetative symptoms , including significant anhedonia. Admission BAL negative,UDS positive for Cannabis Associated Signs/Symptoms: Depression Symptoms:  depressed mood, anhedonia, insomnia, anxiety, loss of energy/fatigue, decreased appetite, states she has lost about 20 lbs over the last three months (Hypo) Manic Symptoms:  Reports some increased irritability, which she attributes to above stressors Anxiety Symptoms:  Reports increased anxiety in the context of above stressors Psychotic Symptoms:  No  PTSD Symptoms: Does not endorse  Total Time spent with patient: 45 minutes  Past Psychiatric History:  Denies history of prior psychiatric admissions, denies any history of suicide attempts or of self injurious behaviors. Denies prior episodes of severe depression, denies history  of mania or hypomania, denies history of GAD , denies history of panic, does endorse some agoraphobia. Denies prior history of violence.   Is the patient at risk to self? Yes.    Has the patient been a risk to self in the past 6 months? No.  Has the patient been a risk to self within the distant past? No.  Is the patient a risk to others? Yes.    Has the patient been a risk to others in the past 6 months? No.  Has the patient been a risk to others within the distant past? No.   Prior Inpatient Therapy:  denies  Prior Outpatient Therapy:  states she has not had prior outpatient treatment  Alcohol Screening:   Substance Abuse History in the last 12 months:  Reports history of cannabis abuse . Denies alcohol or other drug abuse  Consequences of Substance Abuse: States she has been smoking cannabis regularly , but stopped 1/1 as it was a New Year's resolution of hers to stop cannabis use.  Previous Psychotropic Medications: patient was not taking psychiatric medications prior to admission, has never been on psychiatric medications in the past . Psychological Evaluations:  no Past Medical History: Reports history of Asthma for which she uses an inhaler . Past Medical History:  Diagnosis Date  . Anemia     Past Surgical History:  Procedure Laterality Date  . CESAREAN SECTION    . TUBAL LIGATION     Family History: biological father deceased , mother and stepfather alive, has two siblings  Family Psychiatric  History: she states she has very limited knowledge of paternal family history, no maternal family psychiatric history, denies suicides in family, no alcohol abuse in family Tobacco Screening:  smokes about 10 cigarettes per day Social History: married x 11 years , has one son ( 9)  and an adult Museum/gallery conservatorstepdaughter, employed ( as Conservation officer, naturecashier)  Social History   Substance and Sexual Activity  Alcohol Use No     Social History   Substance and Sexual Activity  Drug Use No    Additional Social  History:  Allergies:   Allergies  Allergen Reactions  . Codeine Nausea And Vomiting  . Shellfish Allergy Itching and Swelling  . Prednisone Rash    Causes H/A   Lab Results:  Results for orders placed or performed during the hospital encounter of 11/22/18 (from the past 48 hour(s))  Rapid urine drug screen (hospital performed)     Status: Abnormal   Collection Time: 11/22/18  2:44 AM  Result Value Ref Range   Opiates NONE DETECTED NONE DETECTED   Cocaine NONE DETECTED NONE DETECTED   Benzodiazepines NONE DETECTED NONE DETECTED   Amphetamines NONE DETECTED NONE DETECTED   Tetrahydrocannabinol POSITIVE (A) NONE DETECTED   Barbiturates NONE DETECTED NONE DETECTED    Comment: (NOTE) DRUG SCREEN FOR MEDICAL PURPOSES ONLY.  IF CONFIRMATION IS NEEDED FOR ANY PURPOSE, NOTIFY LAB WITHIN 5 DAYS. LOWEST DETECTABLE LIMITS FOR URINE DRUG SCREEN Drug Class                     Cutoff (ng/mL) Amphetamine and metabolites    1000 Barbiturate and metabolites    200 Benzodiazepine                 200 Tricyclics and metabolites     300 Opiates and metabolites        300 Cocaine and metabolites        300 THC                            50 Performed at [facility], [address] [place], [place] [address]   Comprehensive metabolic panel     Status: Abnormal   Collection Time: 11/22/18  3:00 AM  Result Value Ref Range   Sodium 139 135 - 145 mmol/L   Potassium 3.3 (L) 3.5 - 5.1 mmol/L   Chloride 109 98 - 111 mmol/L   CO2 20 (L) 22 - 32 mmol/L   Glucose, Bld 116 (H) 70 - 99 mg/dL   BUN 17 6 - 20 mg/dL   Creatinine, Ser 1.910.99 0.44 - 1.00 mg/dL   Calcium 9.2 8.9 - 47.810.3 mg/dL   Total Protein 7.3 6.5 - 8.1 g/dL   Albumin 3.7 3.5 - 5.0 g/dL   AST 29 15 - 41 U/L   ALT 31 0 - 44 U/L   Alkaline Phosphatase 67 38 - 126 U/L   Total Bilirubin 0.3 0.3 - 1.2 mg/dL   GFR calc non Af Amer >60 >60 mL/min   GFR calc Af Amer >60 >60 mL/min   Anion gap 10 5 - 15    Comment: Performed at [facility], [address] [place], [place] [address]  Ethanol     Status: None   Collection Time: 11/22/18  3:00 AM  Result Value Ref Range   Alcohol, Ethyl (B) <10 <10 mg/dL    Comment: (NOTE) Lowest detectable limit for serum alcohol is 10 mg/dL. For medical purposes only. Performed at [facility], [address] [place], [place] [address]   cbc     Status: Abnormal   Collection Time: 11/22/18  3:00 AM  Result Value Ref Range   WBC 10.1 4.0 -  10.5 K/uL   RBC 4.04 3.87 - 5.11 MIL/uL   Hemoglobin 11.7 (L) 12.0 - 15.0 g/dL   HCT 34.1 93.7 - 90.2 %   MCV 90.3 80.0 - 100.0 fL   MCH 29.0 26.0 - 34.0 pg   MCHC 32.1 30.0 - 36.0 g/dL   RDW 40.9 (H) 73.5 - 32.9 %   Platelets 337 150 - 400 K/uL   nRBC 0.0 0.0 - 0.2 %    Comment: Performed at [facility], [address], [place], [place] [address]  I-stat troponin, ED     Status: None   Collection Time: 11/22/18  3:01 AM  Result Value Ref Range   Troponin i, poc 0.01 0.00 - 0.08 ng/mL   Comment 3            Comment: Due to the release kinetics of cTnI, a negative result within the first hours of the onset of symptoms does not rule out myocardial infarction with certainty. If myocardial infarction is still suspected, repeat the test at appropriate intervals.     Blood Alcohol level:  Lab Results  Component Value Date   ETH <10 11/22/2018    Metabolic Disorder Labs:  No results found for: HGBA1C, MPG No results found for: PROLACTIN No results found for: CHOL, TRIG, HDL, CHOLHDL, VLDL, LDLCALC  Current Medications: Current Facility-Administered Medications  Medication Dose Route Frequency Provider Last Rate Last Dose  . acetaminophen (TYLENOL) tablet 650 mg  650 mg Oral Q6H PRN Jackelyn Poling, NP      . alum & mag hydroxide-simeth (MAALOX/MYLANTA) 200-200-20 MG/5ML suspension 30 mL  30 mL Oral Q4H PRN Nira Conn A, NP      . hydrOXYzine (ATARAX/VISTARIL) tablet 25 mg  25 mg Oral TID PRN Nira Conn A,  NP      . magnesium hydroxide (MILK OF MAGNESIA) suspension 30 mL  30 mL Oral Daily PRN Nira Conn A, NP      . traZODone (DESYREL) tablet 50 mg  50 mg Oral QHS PRN Jackelyn Poling, NP       PTA Medications: Medications Prior to Admission  Medication Sig Dispense Refill Last Dose  . albuterol (PROVENTIL HFA;VENTOLIN HFA) 108 (90 Base) MCG/ACT inhaler Inhale into the lungs every 6 (six) hours as needed for wheezing or shortness of breath.     Marland Kitchen aspirin-acetaminophen-caffeine (EXCEDRIN MIGRAINE) 250-250-65 MG tablet Take 2 tablets by mouth every 6 (six) hours as needed for headache.   Past Month at Unknown time    Musculoskeletal: Strength & Muscle Tone: within normal limits Gait & Station: normal Patient leans: N/A  Psychiatric Specialty Exam: Physical Exam  Review of Systems  Constitutional: Positive for weight loss.  HENT: Negative.   Eyes: Negative.   Respiratory: Negative.   Cardiovascular: Negative.   Gastrointestinal: Positive for nausea. Negative for diarrhea and vomiting.  Genitourinary: Negative.   Musculoskeletal: Negative.   Skin: Negative.   Neurological: Positive for headaches. Negative for seizures.       Reports headaches related to dental grinding , bruxism  Endo/Heme/Allergies: Negative.   Psychiatric/Behavioral: Positive for depression.  All other systems reviewed and are negative.   Last menstrual period 10/29/2018.There is no height or weight on file to calculate BMI.  General Appearance: Fairly Groomed  Eye Contact:  Fair  Speech:  Normal Rate  Volume:  Decreased  Mood:  Depressed and Dysphoric  Affect:  Congruent and constricted  Thought Process:  Linear and Descriptions of Associations: Intact  Orientation:  Full (Time, Place, and Person)  Thought Content:  no hallucinations, no delusions , not internally preoccupied   Suicidal Thoughts:  No denies suicidal or self injurious ideations, contracts for safety on unit  Homicidal Thoughts:  Yes.  with  intent/plan  Does not endorse HI per se, but reports she continues wanting to " hurt " her husband and stepdaughter, states " I would like to get my hands on her", states " I don't know what would happen if I got my hands on her"  Memory:  recent and remote grossly intact   Judgement:  Fair  Insight:  Fair  Psychomotor Activity:  Decreased  Concentration:  Concentration: Good and Attention Span: Good  Recall:  Good  Fund of Knowledge:  Good  Language:  Good  Akathisia:  Negative  Handed:  Right  AIMS (if indicated):     Assets:  Communication Skills Desire for Improvement Resilience  ADL's:  Intact  Cognition:  WNL  Sleep:       Treatment Plan Summary: Daily contact with patient to assess and evaluate symptoms and progress in treatment, Medication management, Plan inpatient treatment and medications as below  Observation Level/Precautions:  15 minute checks  Laboratory:  as needed  TSH  Psychotherapy:  Milieu, group participation  Medications:  Agrees to Remeron trial, for depression, anxiety, and may also help appetite and sleep. Continue Vistaril PRNs for anxiety. Discontinue Trazodone as being started on Remeron. KDUR supplementation for hypokalemia  Consultations:  -   Discharge Concerns:  - family stressors as above  Estimated LOS: 5 days  Other:     Physician Treatment Plan for Primary Diagnosis:  MDD, no psychotic features  Long Term Goal(s): Improvement in symptoms so as ready for discharge  Short Term Goals: Ability to identify changes in lifestyle to reduce recurrence of condition will improve and Ability to maintain clinical measurements within normal limits will improve  Physician Treatment Plan for Secondary Diagnosis: Homicidal Ideations Long Term Goal(s): Improvement in symptoms so as ready for discharge  Short Term Goals: Ability to identify changes in lifestyle to reduce recurrence of condition will improve, Ability to verbalize feelings will improve,  Ability to disclose and discuss suicidal ideas, Ability to demonstrate self-control will improve, Ability to identify and develop effective coping behaviors will improve and Ability to maintain clinical measurements within normal limits will improve  I certify that inpatient services furnished can reasonably be expected to improve the patient's condition.    Craige CottaFernando A Rubi Tooley, MD 1/12/202012:43 PM
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
## 2018-11-23 DIAGNOSIS — G4700 Insomnia, unspecified: Secondary | ICD-10-CM

## 2018-11-23 DIAGNOSIS — F419 Anxiety disorder, unspecified: Secondary | ICD-10-CM

## 2018-11-23 LAB — LIPID PANEL
Cholesterol: 139 mg/dL (ref 0–200)
HDL: 50 mg/dL (ref >40)
LDL Cholesterol: 72 mg/dL (ref 0–99)
Total CHOL/HDL Ratio: 2.8 RATIO
Triglycerides: 84 mg/dL (ref <150)
VLDL: 17 mg/dL (ref 0–40)

## 2018-11-23 LAB — HEMOGLOBIN A1C
Hgb A1c MFr Bld: 5.5 % (ref 4.8–5.6)
Mean Plasma Glucose: 111.15 mg/dL

## 2018-11-23 LAB — TSH: TSH: 1.814 u[IU]/mL (ref 0.350–4.500)

## 2018-11-23 NOTE — [facility] Group Notes (Signed)
Adult Psychoeducational Group Note  Date:  11/23/2018 Time:  9:19 PM  Group Topic/Focus:  Wrap-Up Group:   The focus of this group is to help patients review their daily goal of treatment and discuss progress on daily workbooks.  Participation Level:  Active  Participation Quality:  Appropriate and Attentive  Affect:  Appropriate  Cognitive:  Alert and Appropriate  Insight: Appropriate and Good  Engagement in Group:  Engaged  Modes of Intervention:  Discussion and Education  Additional Comments:  Pt attended and participated in wrap up group this evening. Pt rated their day a 5/10, due to them getting better the more that they express themselves. Pt has an ongoing goal, which is to forgive themselves.   Chrisandra Netters 11/23/2018, 9:19 PM
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
## 2018-11-23 NOTE — Tx Team (Signed)
Interdisciplinary Treatment and Diagnostic Plan Update  11/23/2018 Time of Session: 9:00am Stacy Cooper MRN: [id number]  Principal Diagnosis: <principal problem not specified>  Secondary Diagnoses: Active Problems:   Severe major depression, single episode, without psychotic features ([place])   Current Medications:  Current Facility-Administered Medications  Medication Dose Route Frequency Provider Last Rate Last Dose  . acetaminophen (TYLENOL) tablet 650 mg  650 mg Oral Q6H PRN Lindon Romp A, NP   650 mg at 11/23/18 0827  . alum & mag hydroxide-simeth (MAALOX/MYLANTA) 200-200-20 MG/5ML suspension 30 mL  30 mL Oral Q4H PRN Lindon Romp A, NP      . feeding supplement (ENSURE ENLIVE) (ENSURE ENLIVE) liquid 237 mL  237 mL Oral BID BM Johnn Hai, MD      . hydrOXYzine (ATARAX/VISTARIL) tablet 25 mg  25 mg Oral TID PRN Lindon Romp A, NP      . magnesium hydroxide (MILK OF MAGNESIA) suspension 30 mL  30 mL Oral Daily PRN Lindon Romp A, NP      . mirtazapine (REMERON) tablet 7.5 mg  7.5 mg Oral QHS Cobos, Myer Peer, MD   7.5 mg at 11/22/18 2109  . pneumococcal 23 valent vaccine (PNU-IMMUNE) injection 0.5 mL  0.5 mL Intramuscular Tomorrow-1000 Cobos, Myer Peer, MD       PTA Medications: Medications Prior to Admission  Medication Sig Dispense Refill Last Dose  . albuterol (PROVENTIL HFA;VENTOLIN HFA) 108 (90 Base) MCG/ACT inhaler Inhale into the lungs every 6 (six) hours as needed for wheezing or shortness of breath.     Marland Kitchen aspirin-acetaminophen-caffeine (EXCEDRIN MIGRAINE) 250-250-65 MG tablet Take 2 tablets by mouth every 6 (six) hours as needed for headache.   Past Month at Unknown time    Patient Stressors:    Patient Strengths:    Treatment Modalities: Medication Management, Group therapy, Case management,  1 to 1 session with clinician, Psychoeducation, Recreational therapy.   Physician Treatment Plan for Primary Diagnosis: <principal problem not specified> Long Term  Goal(s): Improvement in symptoms so as ready for discharge Improvement in symptoms so as ready for discharge   Short Term Goals: Ability to identify changes in lifestyle to reduce recurrence of condition will improve Ability to maintain clinical measurements within normal limits will improve Ability to identify changes in lifestyle to reduce recurrence of condition will improve Ability to verbalize feelings will improve Ability to disclose and discuss suicidal ideas Ability to demonstrate self-control will improve Ability to identify and develop effective coping behaviors will improve Ability to maintain clinical measurements within normal limits will improve  Medication Management: Evaluate patient's response, side effects, and tolerance of medication regimen.  Therapeutic Interventions: 1 to 1 sessions, Unit Group sessions and Medication administration.  Evaluation of Outcomes: Not Met  Physician Treatment Plan for Secondary Diagnosis: Active Problems:   Severe major depression, single episode, without psychotic features ([place])  Long Term Goal(s): Improvement in symptoms so as ready for discharge Improvement in symptoms so as ready for discharge   Short Term Goals: Ability to identify changes in lifestyle to reduce recurrence of condition will improve Ability to maintain clinical measurements within normal limits will improve Ability to identify changes in lifestyle to reduce recurrence of condition will improve Ability to verbalize feelings will improve Ability to disclose and discuss suicidal ideas Ability to demonstrate self-control will improve Ability to identify and develop effective coping behaviors will improve Ability to maintain clinical measurements within normal limits will improve     Medication Management: Evaluate patient's response, side  effects, and tolerance of medication regimen.  Therapeutic Interventions: 1 to 1 sessions, Unit Group sessions and Medication  administration.  Evaluation of Outcomes: Not Met   RN Treatment Plan for Primary Diagnosis: <principal problem not specified> Long Term Goal(s): Knowledge of disease and therapeutic regimen to maintain health will improve  Short Term Goals: Ability to remain free from injury will improve, Ability to verbalize feelings will improve, Ability to disclose and discuss suicidal ideas, Ability to identify and develop effective coping behaviors will improve and Compliance with prescribed medications will improve  Medication Management: RN will administer medications as ordered by provider, will assess and evaluate patient's response and provide education to patient for prescribed medication. RN will report any adverse and/or side effects to prescribing provider.  Therapeutic Interventions: 1 on 1 counseling sessions, Psychoeducation, Medication administration, Evaluate responses to treatment, Monitor vital signs and CBGs as ordered, Perform/monitor CIWA, COWS, AIMS and Fall Risk screenings as ordered, Perform wound care treatments as ordered.  Evaluation of Outcomes: Not Met   LCSW Treatment Plan for Primary Diagnosis: <principal problem not specified> Long Term Goal(s): Safe transition to appropriate next level of care at discharge, Engage patient in therapeutic group addressing interpersonal concerns.  Short Term Goals: Engage patient in aftercare planning with referrals and resources, Increase social support, Increase emotional regulation, Identify triggers associated with mental health/substance abuse issues and Increase skills for wellness and recovery  Therapeutic Interventions: Assess for all discharge needs, 1 to 1 time with Social worker, Explore available resources and support systems, Assess for adequacy in community support network, Educate family and significant other(s) on suicide prevention, Complete Psychosocial Assessment, Interpersonal group therapy.  Evaluation of Outcomes: Not  Met   Progress in Treatment: Attending groups: No. New to unit. Participating in groups: No. Taking medication as prescribed: Yes. Toleration medication: Yes. Family/Significant other contact made: No, will contact:  supports if consent is granted Patient understands diagnosis: Yes. Discussing patient identified problems/goals with staff: Yes. Medical problems stabilized or resolved: No. Denies suicidal/homicidal ideation: No. Issues/concerns per patient self-inventory: Yes.  New problem(s) identified: Yes, Describe:  endorsing HI toward family in the home  New Short Term/Long Term Goal(s): medication management for mood stabilization; elimination of SI thoughts; development of comprehensive mental wellness/sobriety plan.  Patient Goals:  "I need to learn how to forgive and manage my emotions."  Discharge Plan or Barriers: Continuing to assess. [place] pamphlet, Mobile Crisis information, and AA/NA information provided to patient for additional community support and resources.   Reason for Continuation of Hospitalization: Aggression Anxiety Depression Homicidal ideation Medication stabilization Suicidal ideation  Estimated Length of Stay: 5-7 days  Attendees: Patient: Stacy Cooper 11/23/2018 8:30 AM  Physician: Larene Beach 11/23/2018 8:30 AM  Nursing: Yetta Flock, RN 11/23/2018 8:30 AM  RN Care Manager: 11/23/2018 8:30 AM  Social Worker: Stephanie Acre, [place] 11/23/2018 8:30 AM  Recreational Therapist:  11/23/2018 8:30 AM  Other:  11/23/2018 8:30 AM  Other:  11/23/2018 8:30 AM  Other: 11/23/2018 8:30 AM    Scribe for Treatment Team: Joellen Jersey, LCSWA 11/23/2018 8:30 AM

## 2018-11-23 NOTE — [facility] Suicide Risk Assessment (Signed)
[facility] INPATIENT:  Family/Significant Other Suicide Prevention Education  Suicide Prevention Education:  Patient Refusal for Family/Significant Other Suicide Prevention Education: The patient Stacy Cooper has refused to provide written consent for family/significant other to be provided Family/Significant Other Suicide Prevention Education during admission and/or prior to discharge.  Physician notified.  Darreld Mclean 11/23/2018, 2:47 PM

## 2018-11-23 NOTE — Progress Notes (Signed)
Patient ID: Stacy Cooper, female   DOB: [birth year], 48 y.o.   MRN: [id number] D: Patient in dayroom interacting with peers on approach. Pt reports she is doing well. Pt mood and affect appeared depressed and flat. Pt reports she is "learning to forgive herself and in turn forgiving others".  Pt denies SI/HI/AVH and pain. Pt attended and engaged in evening wrap up group. Cooperative with assessment. No acute distressed noted at this time.   A: Medications administered as prescribed. Support and encouragement provided to attend groups and engage in milieu. Pt encouraged to discuss feelings and come to staff with any question or concerns.   R: Patient remains safe and complaint with medications.

## 2018-11-23 NOTE — Progress Notes (Signed)
Recreation Therapy Notes  Date: 1.13.20 Time: 0930 Location: [address] Dayroom  Group Topic: Stress Management  Goal Area(s) Addresses:  Patient will identify stress management techniques. Patient will identify benefits of using stress management techniques post d/c.  Intervention: Stress Management  Activity :  Meditation.  LRT introduced the stress management technique of meditation.  LRT played a meditation on being resilient in the face of adversity.  Patients were to follow along as meditation played to engage in activity.  Education:  Stress Management, Discharge Planning.   Education Outcome: Acknowledges Education  Clinical Observations/Feedback: Pt did not attend group.    Caroll Rancher, LRT/CTRS         Caroll Rancher A 11/23/2018 11:54 AM

## 2018-11-23 NOTE — Progress Notes (Signed)
Pt is in bed in her room upon initial approach.  She presents with depressed affect and mood and reports she would like "to get some sleep" tonight.  When asked how her day is going, she states "it's going."  Pt denies SI/HI and hallucinations.  She reports mouth pain of 5/10 "because I don't have my mouthguard."  She has isolated to her room for the majority of the evening and she did not attend evening group.  Introduced self to pt.  Actively listened to pt and offered support and encouragement.  Medication administered per order.  PRN medication administered for pain.  Q15 minute safety checks maintained.  Pt is compliant with medications.  She verbally contracts for safety and reports she will inform staff of needs and concerns.  Will continue to monitor and assess.

## 2018-11-23 NOTE — [facility] Group Notes (Addendum)
LCSW Group Therapy Note 11/23/2018 11:03 AM  Type of Therapy and Topic: Group Therapy: Overcoming Obstacles  Participation Level: Active  Description of Group:  In this group patients will be encouraged to explore what they see as obstacles to their own wellness and recovery. They will be guided to discuss their thoughts, feelings, and behaviors related to these obstacles. The group will process together ways to cope with barriers, with attention given to specific choices patients can make. Each patient will be challenged to identify changes they are motivated to make in order to overcome their obstacles. This group will be process-oriented, with patients participating in exploration of their own experiences as well as giving and receiving support and challenge from other group members.  Therapeutic Goals: 1. Patient will identify personal and current obstacles as they relate to admission. 2. Patient will identify barriers that currently interfere with their wellness or overcoming obstacles.  3. Patient will identify feelings, thought process and behaviors related to these barriers. 4. Patient will identify two changes they are willing to make to overcome these obstacles:   Summary of Patient Progress  Stacy Cooper was engaged and participated throughout the group session. Stacy Cooper reports that her main obstacle is "sadness over my husband's betrayal". Stacy Cooper states that her husband recently turned his back on her. She states that she does not know how to cope with their separation due to not having many social relationships outside of theirs. Stacy Cooper reports that she pleases everyone, but herself and that it is time to learn how to love herself again.   Therapeutic Modalities:  Cognitive Behavioral Therapy Solution Focused Therapy Motivational Interviewing Relapse Prevention Therapy   Alcario Drought Clinical Social Worker

## 2018-11-23 NOTE — Plan of Care (Signed)
Patient was flat/depressed upon approach this morning. Said she slept okay, and denies SI HI AVH. Endorsed physical pain 7/10 on the left side of her mouth due to not having her night guard. No questions or concerns. Patient is compliant with medications prescribed per provider. No side effects noted. Safety is maintained with 15 minute checks as well as environmental checks. Will continue to monitor and provide support.   Problem: Education: Goal: Knowledge of the prescribed therapeutic regimen will improve Outcome: Progressing   Problem: Activity: Goal: Interest or engagement in leisure activities will improve Outcome: Progressing Goal: Imbalance in normal sleep/wake cycle will improve Outcome: Progressing   Problem: Coping: Goal: Will verbalize feelings Outcome: Progressing   Problem: Health Behavior/Discharge Planning: Goal: Ability to make decisions will improve Outcome: Progressing   Problem: Coping: Goal: Coping ability will improve Outcome: Not Progressing

## 2018-11-23 NOTE — Progress Notes (Signed)
[facility] MD Progress Note  11/23/2018 3:06 PM Lenola Lockner  MRN:  [id number] Subjective: Patient describes lingering depression and anger.  Continues to ruminate about her stressors, sleep relationship stress with her stepdaughter and her husband.  Denies medication side effects thus far.  Denies suicidal ideations.  Today does not endorse homicidal ideations, states "I just want him to understand, but they do not listen".  Objective: I have discussed case with treatment team and have met with patient 48 year old female, presented to ED via [employer], which she contacted herself.  On day of admission had an argument with stepdaughter which was escalating.  Patient states she felt "like really hurting her if I could".  Endorses thoughts of "hurting" both her adult stepdaughter and her husband.  In addition to the above she also describes worsening depression, presents sad, depressed, endorses significant neurovegetative symptoms.  Denies any recent suicidal ideations, no psychotic symptoms. Attributes above symptoms to increased family stress.  States her stepdaughter moved in several weeks ago and has been disruptive to the family and to the household.  Patient states that her husband tends to take stepdaughter side rather than hers.  Patient denies prior psychiatric history.  Today continues to present depressed and ruminative regarding stressors as above.  She does seem improved, however and affect becomes more reactive during session-smiles at times appropriately.  She continues to express a lot of anger towards her stepdaughter and towards her husband "for taking her side rather than mine", but today does not make any specific homicidal ideation or any threat of physical violence.  States that all she wants is for them to understand her situation and how she feels, although reports that she would likely become more angry because "they do not listen". Denies suicidal ideations. No disruptive or agitated  behaviors on unit. Tolerating Remeron trial well thus far. Labs reviewed-lipid panel, HgbA1C, TSH WNL.   Principal Problem:  MDD, Homicidal Ideations Diagnosis: Active Problems:   Severe major depression, single episode, without psychotic features ([place])  Total Time spent with patient: 20 minutes  Past Psychiatric History:   Past Medical History:  Past Medical History:  Diagnosis Date  . Anemia     Past Surgical History:  Procedure Laterality Date  . CESAREAN SECTION    . TUBAL LIGATION     Family History: History reviewed. No pertinent family history. Family Psychiatric  History:  Social History:  Social History   Substance and Sexual Activity  Alcohol Use No     Social History   Substance and Sexual Activity  Drug Use No    Social History   Socioeconomic History  . Marital status: Married    Spouse name: Not on file  . Number of children: Not on file  . Years of education: Not on file  . Highest education level: Not on file  Occupational History  . Not on file  Social Needs  . Financial resource strain: Not hard at all  . Food insecurity:    Worry: Never true    Inability: Never true  . Transportation needs:    Medical: No    Non-medical: No  Tobacco Use  . Smoking status: Current Every Day Smoker    Packs/day: 0.25    Types: Cigarettes  . Smokeless tobacco: Never Used  Substance and Sexual Activity  . Alcohol use: No  . Drug use: No  . Sexual activity: Yes    Birth control/protection: None  Lifestyle  . Physical activity:    Days  per week: 0 days    Minutes per session: 0 min  . Stress: Very much  Relationships  . Social connections:    Talks on phone: Once a week    Gets together: Once a week    Attends religious service: 1 to 4 times per year    Active member of club or organization: No    Attends meetings of clubs or organizations: Not on file    Relationship status: Married  Other Topics Concern  . Not on file  Social History  Narrative  . Not on file   Additional Social History:    Pain Medications: (P) Please see MAR Prescriptions: (P) Please see MAR Over the Counter: (P) Please see MAR History of alcohol / drug use?: (P) Yes Longest period of sobriety (when/how long): (P) 2 weeks Name of Substance 1: Marijuana 1 - Age of First Use: 48 years old 1 - Amount (size/oz): Unknown 1 - Frequency: Unknown 1 - Duration: 20 years off-and-on 1 - Last Use / Amount: 2 weeks ago  Sleep: Fair  Appetite:  Fair  Current Medications: Current Facility-Administered Medications  Medication Dose Route Frequency Provider Last Rate Last Dose  . acetaminophen (TYLENOL) tablet 650 mg  650 mg Oral Q6H PRN Lindon Romp A, NP   650 mg at 11/23/18 0827  . alum & mag hydroxide-simeth (MAALOX/MYLANTA) 200-200-20 MG/5ML suspension 30 mL  30 mL Oral Q4H PRN Lindon Romp A, NP      . feeding supplement (ENSURE ENLIVE) (ENSURE ENLIVE) liquid 237 mL  237 mL Oral BID BM Johnn Hai, MD   237 mL at 11/23/18 1030  . hydrOXYzine (ATARAX/VISTARIL) tablet 25 mg  25 mg Oral TID PRN Lindon Romp A, NP      . magnesium hydroxide (MILK OF MAGNESIA) suspension 30 mL  30 mL Oral Daily PRN Lindon Romp A, NP      . mirtazapine (REMERON) tablet 7.5 mg  7.5 mg Oral QHS Nasira Janusz, Myer Peer, MD   7.5 mg at 11/22/18 2109  . pneumococcal 23 valent vaccine (PNU-IMMUNE) injection 0.5 mL  0.5 mL Intramuscular Tomorrow-1000 Payge Eppes, Myer Peer, MD        Lab Results:  Results for orders placed or performed during the hospital encounter of 11/22/18 (from the past 48 hour(s))  Hemoglobin A1c     Status: None   Collection Time: 11/23/18  6:50 AM  Result Value Ref Range   Hgb A1c MFr Bld 5.5 4.8 - 5.6 %    Comment: (NOTE) Pre diabetes:          5.7%-6.4% Diabetes:              >6.4% Glycemic control for   <7.0% adults with diabetes    Mean Plasma Glucose 111.15 mg/dL    Comment: Performed at [facility], [place] [address], [place], [place] [address]   Lipid panel     Status: None   Collection Time: 11/23/18  6:50 AM  Result Value Ref Range   Cholesterol 139 0 - 200 mg/dL   Triglycerides 84 <150 mg/dL   HDL 50 >40 mg/dL   Total CHOL/HDL Ratio 2.8 RATIO   VLDL 17 0 - 40 mg/dL   LDL Cholesterol 72 0 - 99 mg/dL    Comment:        Total Cholesterol/HDL:CHD Risk Coronary Heart Disease Risk Table                     Men  Women  1/2 Average Risk   3.4   3.3  Average Risk       5.0   4.4  2 X Average Risk   9.6   7.1  3 X Average Risk  23.4   11.0        Use the calculated Patient Ratio above and the CHD Risk Table to determine the patient's CHD Risk.        ATP III CLASSIFICATION (LDL):  <100     mg/dL   Optimal  100-129  mg/dL   Near or Above                    Optimal  130-159  mg/dL   Borderline  160-189  mg/dL   High  >190     mg/dL   Very High Performed at [facility] [address], [place], [place] [address]   TSH     Status: None   Collection Time: 11/23/18  6:50 AM  Result Value Ref Range   TSH 1.814 0.350 - 4.500 uIU/mL    Comment: Performed by a 3rd Generation assay with a functional sensitivity of <=0.01 uIU/mL. Performed at [facility], [place] [address], [place], [place] [address]     Blood Alcohol level:  Lab Results  Component Value Date   ETH <10 71/24/5809    Metabolic Disorder Labs: Lab Results  Component Value Date   HGBA1C 5.5 11/23/2018   MPG 111.15 11/23/2018   No results found for: PROLACTIN Lab Results  Component Value Date   CHOL 139 11/23/2018   TRIG 84 11/23/2018   HDL 50 11/23/2018   CHOLHDL 2.8 11/23/2018   VLDL 17 11/23/2018   LDLCALC 72 11/23/2018    Physical Findings: AIMS: Facial and Oral Movements Muscles of Facial Expression: None, normal Lips and Perioral Area: None, normal Jaw: None, normal Tongue: None, normal,Extremity Movements Upper (arms, wrists, hands, fingers): None, normal Lower (legs, knees, ankles, toes): None,  normal, Trunk Movements Neck, shoulders, hips: None, normal, Overall Severity Severity of abnormal movements (highest score from questions above): None, normal Incapacitation due to abnormal movements: None, normal Patient's awareness of abnormal movements (rate only patient's report): No Awareness, Dental Status Current problems with teeth and/or dentures?: No Does patient usually wear dentures?: No  CIWA:  CIWA-Ar Total: 1 COWS:  COWS Total Score: 0  Musculoskeletal: Strength & Muscle Tone: within normal limits Gait & Station: normal Patient leans: N/A  Psychiatric Specialty Exam: Physical Exam  ROS denies chest pain, no shortness of breath, no vomiting  Blood pressure (!) 133/94, pulse 65, temperature 98.3 F (36.8 C), temperature source Oral, resp. rate 17, height '5\' 5"'$  (1.651 m), weight 69.9 kg, last menstrual period 10/29/2018, SpO2 100 %.Body mass index is 25.63 kg/m.  General Appearance: Fairly Groomed  Eye Contact:  Good  Speech:  Normal Rate  Volume:  Normal  Mood:  Remains depressed  Affect:  Constricted although more reactive today smiles at times appropriately during session  Thought Process:  Linear and Descriptions of Associations: Intact  Orientation:  Other:  Fully alert and attentive  Thought Content:  No hallucinations, no delusions, ruminative about stressors  Suicidal Thoughts:  No-denies suicidal or self-injurious ideations  Homicidal Thoughts:  As above, currently does not endorse active homicidal or violent ideations towards husband or stepdaughter but continues to ruminate and express anger towards them  Memory:  Recent and remote grossly intact  Judgement:  Fair  Insight:  Fair  Psychomotor Activity:  Normal  Concentration:  Concentration: Good and Attention Span: Good  Recall:  Good  Fund of Knowledge:  Good  Language:  Good  Akathisia:  Negative  Handed:  Right  AIMS (if indicated):     Assets:  Desire for Improvement Resilience  ADL's:  Intact   Cognition:  WNL  Sleep:  Number of Hours: 3   Assessment-  48 year old female, presented to ED via [employer], which she contacted herself.  On day of admission had an argument with stepdaughter which was escalating.  Patient states she felt "like really hurting her if I could".  Endorses thoughts of "hurting" both her adult stepdaughter and her husband.  In addition to the above she also describes worsening depression, presents sad, depressed, endorses significant neurovegetative symptoms.  Denies any recent suicidal ideations, no psychotic symptoms. Attributes above symptoms to increased family stress.  States her stepdaughter moved in several weeks ago and has been disruptive to the family and to the household.  Patient states that her husband tends to take stepdaughter side rather than hers.  Patient denies prior psychiatric history.  Patient remains depressed, denies suicidal ideations , remains ruminative about home/family stressors, affect is more reactive and in general presents less overtly angry today.  Currently denies homicidal ideations towards stepdaughter husband but continues to express significant anger, frustration and states she just wants to be understood by them, but does not know how she would react with them because she does not feel listened to.  Thus far tolerating Remeron well.   Treatment Plan Summary: Daily contact with patient to assess and evaluate symptoms and progress in treatment, Medication management, Plan Inpatient treatment and Medications as below  Treatment plan reviewed as below today 1/13 Continue to encourage group and milieu participation to work on coping skills and symptom reduction Continue Remeron 7.5 mg nightly for insomnia/depression Continue Vistaril 25 mg every 8 hours PRN for anxiety Jenne Campus, MD 11/23/2018, 3:06 PM

## 2018-11-24 DIAGNOSIS — F332 Major depressive disorder, recurrent severe without psychotic features: Secondary | ICD-10-CM

## 2018-11-24 NOTE — Progress Notes (Signed)
[facility] MD Progress Note  11/24/2018 11:25 AM Stacy Cooper  MRN:  [id number] Subjective: Patient is seen and examined.  Patient is a 48 year old female. Presented to ED via [employer], whom she reportedly called herself. On day of admission she had an argument with stepdaughter which escalated .Patient states she felt " like really hurting her if I could". On admission reported homicidal ideations towards stepdaughter and husband.   Objective: Patient is seen and examined.  Patient is a 48 year old female with the above-stated past psychiatric history who is seen in follow-up.  She stated she is feeling a bit better today.  She stated that she has decreased suicidal ideation and decreased homicidal ideation.  She stated that she realized that she could not return to the home she was in.  She stated she wanted to focus on herself and her 47-year-old and the make their lives better.  She stated that she was learning to forgive herself and turn to forgiving others.  Overall she denied any suicidal, homicidal ideation or auditory or visual hallucinations.  She denied any side effects to her current medications.  She remains on mirtazapine 7.5 mg p.o. nightly.  She denied any side effects to it.  Her blood pressure is 132/93 today, pulse is 65.  She is afebrile.  She slept 6 hours last night.  Review of her laboratories showed her potassium being slightly low at 3.3, glucose slightly high at 116, liver function enzymes were normal, CBC was essentially normal.  Drug screen was positive for marijuana.  Principal Problem: <principal problem not specified> Diagnosis: Active Problems:   Severe major depression, single episode, without psychotic features (HCC)  Total Time spent with patient: 20 minutes  Past Psychiatric History: See admission H&P  Past Medical History:  Past Medical History:  Diagnosis Date  . Anemia     Past Surgical History:  Procedure Laterality Date  . CESAREAN SECTION    . TUBAL  LIGATION     Family History: History reviewed. No pertinent family history. Family Psychiatric  History: See admission H&P Social History:  Social History   Substance and Sexual Activity  Alcohol Use No     Social History   Substance and Sexual Activity  Drug Use No    Social History   Socioeconomic History  . Marital status: Married    Spouse name: Not on file  . Number of children: Not on file  . Years of education: Not on file  . Highest education level: Not on file  Occupational History  . Not on file  Social Needs  . Financial resource strain: Not hard at all  . Food insecurity:    Worry: Never true    Inability: Never true  . Transportation needs:    Medical: No    Non-medical: No  Tobacco Use  . Smoking status: Current Every Day Smoker    Packs/day: 0.25    Types: Cigarettes  . Smokeless tobacco: Never Used  Substance and Sexual Activity  . Alcohol use: No  . Drug use: No  . Sexual activity: Yes    Birth control/protection: None  Lifestyle  . Physical activity:    Days per week: 0 days    Minutes per session: 0 min  . Stress: Very much  Relationships  . Social connections:    Talks on phone: Once a week    Gets together: Once a week    Attends religious service: 1 to 4 times per year  Active member of club or organization: No    Attends meetings of clubs or organizations: Not on file    Relationship status: Married  Other Topics Concern  . Not on file  Social History Narrative  . Not on file   Additional Social History:    Pain Medications: (P) Please see MAR Prescriptions: (P) Please see MAR Over the Counter: (P) Please see MAR History of alcohol / drug use?: (P) Yes Longest period of sobriety (when/how long): (P) 2 weeks Name of Substance 1: Marijuana 1 - Age of First Use: 48 years old 1 - Amount (size/oz): Unknown 1 - Frequency: Unknown 1 - Duration: 20 years off-and-on 1 - Last Use / Amount: 2 weeks ago                   Sleep: Fair  Appetite:  Fair  Current Medications: Current Facility-Administered Medications  Medication Dose Route Frequency Provider Last Rate Last Dose  . acetaminophen (TYLENOL) tablet 650 mg  650 mg Oral Q6H PRN Nira Conn A, NP   650 mg at 11/24/18 0759  . alum & mag hydroxide-simeth (MAALOX/MYLANTA) 200-200-20 MG/5ML suspension 30 mL  30 mL Oral Q4H PRN Nira Conn A, NP      . feeding supplement (ENSURE ENLIVE) (ENSURE ENLIVE) liquid 237 mL  237 mL Oral BID BM Malvin Johns, MD   237 mL at 11/23/18 1030  . hydrOXYzine (ATARAX/VISTARIL) tablet 25 mg  25 mg Oral TID PRN Nira Conn A, NP      . magnesium hydroxide (MILK OF MAGNESIA) suspension 30 mL  30 mL Oral Daily PRN Nira Conn A, NP      . mirtazapine (REMERON) tablet 7.5 mg  7.5 mg Oral QHS Cobos, Rockey Situ, MD   7.5 mg at 11/23/18 2203  . pneumococcal 23 valent vaccine (PNU-IMMUNE) injection 0.5 mL  0.5 mL Intramuscular Tomorrow-1000 Cobos, Rockey Situ, MD        Lab Results:  Results for orders placed or performed during the hospital encounter of 11/22/18 (from the past 48 hour(s))  Hemoglobin A1c     Status: None   Collection Time: 11/23/18  6:50 AM  Result Value Ref Range   Hgb A1c MFr Bld 5.5 4.8 - 5.6 %    Comment: (NOTE) Pre diabetes:          5.7%-6.4% Diabetes:              >6.4% Glycemic control for   <7.0% adults with diabetes    Mean Plasma Glucose 111.15 mg/dL    Comment: Performed at [facility], [address], [place], [place] [address]  Lipid panel     Status: None   Collection Time: 11/23/18  6:50 AM  Result Value Ref Range   Cholesterol 139 0 - 200 mg/dL   Triglycerides 84 <604 mg/dL   HDL 50 >54 mg/dL   Total CHOL/HDL Ratio 2.8 RATIO   VLDL 17 0 - 40 mg/dL   LDL Cholesterol 72 0 - 99 mg/dL    Comment:        Total Cholesterol/HDL:CHD Risk Coronary Heart Disease Risk Table                     Men   Women  1/2 Average Risk   3.4   3.3  Average Risk       5.0   4.4  2 X Average  Risk   9.6   7.1  3  X Average Risk  23.4   11.0        Use the calculated Patient Ratio above and the CHD Risk Table to determine the patient's CHD Risk.        ATP III CLASSIFICATION (LDL):  <100     mg/dL   Optimal  [id number]  mg/dL   Near or Above                    Optimal  130-159  mg/dL   Borderline  [id number]  mg/dL   High  >956>190     mg/dL   Very High Performed at [facility], [address] [place], [place] [address]   TSH     Status: None   Collection Time: 11/23/18  6:50 AM  Result Value Ref Range   TSH 1.814 0.350 - 4.500 uIU/mL    Comment: Performed by a 3rd Generation assay with a functional sensitivity of <=0.01 uIU/mL. Performed at [facility], [address] [place], [place] [address]     Blood Alcohol level:  Lab Results  Component Value Date   ETH <10 11/22/2018    Metabolic Disorder Labs: Lab Results  Component Value Date   HGBA1C 5.5 11/23/2018   MPG 111.15 11/23/2018   No results found for: PROLACTIN Lab Results  Component Value Date   CHOL 139 11/23/2018   TRIG 84 11/23/2018   HDL 50 11/23/2018   CHOLHDL 2.8 11/23/2018   VLDL 17 11/23/2018   LDLCALC 72 11/23/2018    Physical Findings: AIMS: Facial and Oral Movements Muscles of Facial Expression: None, normal Lips and Perioral Area: None, normal Jaw: None, normal Tongue: None, normal,Extremity Movements Upper (arms, wrists, hands, fingers): None, normal Lower (legs, knees, ankles, toes): None, normal, Trunk Movements Neck, shoulders, hips: None, normal, Overall Severity Severity of abnormal movements (highest score from questions above): None, normal Incapacitation due to abnormal movements: None, normal Patient's awareness of abnormal movements (rate only patient's report): No Awareness, Dental Status Current problems with teeth and/or dentures?: No Does patient usually wear dentures?: No  CIWA:  CIWA-Ar Total: 1 COWS:  COWS Total Score:  0  Musculoskeletal: Strength & Muscle Tone: within normal limits Gait & Station: normal Patient leans: N/A  Psychiatric Specialty Exam: Physical Exam  Nursing note and vitals reviewed. Constitutional: She is oriented to person, place, and time. She appears well-developed and well-nourished.  HENT:  Head: Normocephalic and atraumatic.  Respiratory: Effort normal.  Neurological: She is alert and oriented to person, place, and time.    ROS  Blood pressure (!) 132/93, pulse 65, temperature 99.9 F (37.7 C), temperature source Oral, resp. rate 16, height 5\' 5"  (1.651 m), weight 69.9 kg, last menstrual period 10/29/2018, SpO2 100 %.Body mass index is 25.63 kg/m.  General Appearance: Casual  Eye Contact:  Fair  Speech:  Normal Rate  Volume:  Normal  Mood:  Anxious  Affect:  Congruent  Thought Process:  Coherent and Descriptions of Associations: Intact  Orientation:  Full (Time, Place, and Person)  Thought Content:  Logical  Suicidal Thoughts:  No  Homicidal Thoughts:  No  Memory:  Immediate;   Fair Recent;   Fair Remote;   Fair  Judgement:  Intact  Insight:  Fair  Psychomotor Activity:  Normal  Concentration:  Concentration: Fair and Attention Span: Fair  Recall:  [employer]  Fund of Knowledge:  Fair  Language:  Fair  Akathisia:  Negative  Handed:  Right  AIMS (  if indicated):     Assets:  Communication Skills Desire for Improvement Physical Health Resilience  ADL's:  Intact  Cognition:  WNL  Sleep:  Number of Hours: 6     Treatment Plan Summary: Daily contact with patient to assess and evaluate symptoms and progress in treatment, Medication management and Plan : 48 year old female, presented to ED via [employer], which she contacted herself.  On day of admission had an argument with stepdaughter which was escalating.  Patient states she felt "like really hurting her if I could".  Endorses thoughts of "hurting" both her adult stepdaughter and her husband.  In addition to the above  she also describes worsening depression, presents sad, depressed, endorses significant neurovegetative symptoms.  Denies any recent suicidal ideations, no psychotic symptoms.  Patient admitted today that she is feeling a bit better.  She denied any suicidal or homicidal ideation.  She stated she wanted to focus on her 69-year-old son and herself.  She is concerned about housing, and I related to her to discuss with social work what other options might be available.  She does not want to return to the home where her husband is.  We will not make any changes to her current medications.  We will continue the Remeron at 7.5 mg p.o. nightly.  I will order a potassium just to make sure that it is in the normal range. 1.  Continue mirtazapine 7.5 mg p.o. nightly for sleep and for mood. 2.  Continue hydroxyzine 25 mg p.o. 3 times daily as needed anxiety. 3.  Repeat chemistries to assess potassium. 4.  Discharge planning-in progress.  Antonieta Pert, MD 11/24/2018, 11:25 AM

## 2018-11-24 NOTE — Plan of Care (Signed)
D: Patient presents mildly depressed, but feeling better today than previously. She reports never experiencing SI and currently denies HI. She slept well last night and received medication that was helpful. Her appetite is fair, energy normal and concentration good. She rates her depression and hopelessness 0/10, anxiety 3/10. She denies withdrawal symptoms. She complains of pain on left side of mouth 7/10. Patient denies SI/HI/AVH.  A: Patient checked q15 min, and checks reviewed. Reviewed medication changes with patient and educated on side effects. Educated patient on importance of attending group therapy sessions and educated on several coping skills. Encouarged participation in milieu through recreation therapy and attending meals with peers. Support and encouragement provided. Fluids offered. R: Patient receptive to education on medications, and is medication compliant. Patient contracts for safety on the unit. Goal: "to forgive my husband" and "decide if I want to meet with him or not."

## 2018-11-24 NOTE — Progress Notes (Signed)
Recreation Therapy Notes  Animal-Assisted Activity (AAA) Program Checklist/Progress Notes Patient Eligibility Criteria Checklist & Daily Group note for Rec Tx Intervention  Date: 1.14.20 Time: 1430 Location: [address]   AAA/T Program Assumption of Risk Form signed by Engineer, production or Parent Legal Guardian  YES   Patient is free of allergies or sever asthma  YES   Patient reports no fear of animals  YES   Patient reports no history of cruelty to animals  YES   Patient understands his/her participation is voluntary  YES   Patient washes hands before animal contact  YES   Patient washes hands after animal contact  YES         Education: Charity fundraiser, Appropriate Animal Interaction   Education Outcome: Acknowledges understanding/In group clarification offered/Needs additional education.   Clinical Observations/Feedback: Pt did not attend group.    Caroll Rancher, LRT/CTRS         Caroll Rancher A 11/24/2018 3:38 PM

## 2018-11-24 NOTE — [facility] Group Notes (Signed)
LCSW Group Therapy Note 11/24/2018 10:43 AM  Type of Therapy and Topic: Group Therapy: DBT House  Participation Level: Active  Description of Group:  In this group patients will be encouraged to explore  their values, behaviors they want to change, emotions they wish to increase, protective factors, supports, coping skills, and motivational factors. They will be guided to discuss their thoughts, feelings, and behaviors related to these obstacles. The group will be asked to individually process the activity and share their insights with the group. This group will be process-oriented, with patients participating in exploration of their own experiences as well as giving and receiving support and challenge from other group members.   Therapeutic Goals: 1. Patient will identify their values 2. Patient will identify behaviors they wish to modify  3. Patient will identify feelings and emotions they wish to increase 4. Patient will identify strengths, supports, protective factors 5. Patient will identify coping skills 6. Patient will identify goals and motivating factors for change   Summary of Patient Progress Patient participated in a deep breathing meditation and actively contributed to the discussion. Patient shared that she values love, happiness, and togetherness. Patient identifies her coping skills as taking walks and cleaning.  Therapeutic Modalities:  Dialectical Behavioral Therapy  Motivational Interviewing Relapse Prevention Therapy

## 2018-11-24 NOTE — B[facility] Group Notes (Signed)
Adult Psychoeducational Group Note  Date:  11/24/2018 Time:  9:25 PM  Group Topic/Focus:  Wrap-Up Group:   The focus of this group is to help patients review their daily goal of treatment and discuss progress on daily workbooks.  Participation Level:  Active  Participation Quality:  Appropriate and Attentive  Affect:  Appropriate  Cognitive:  Alert and Appropriate  Insight: Appropriate and Good  Engagement in Group:  Engaged  Modes of Intervention:  Discussion and Education  Additional Comments:  Pt attended and participated in wrap up group this evening. Pt rated their day a 10/10, due to thing getting better and them talking and engaging more. Pt completed their goal, which was to stay focused and getting the necessary information from their SW.   Stacy Cooper 11/24/2018, 9:25 PM

## 2018-11-24 NOTE — [facility] Group Notes (Signed)
[facility] Group Notes:  (Nursing/MHT/Case Management/Adjunct)  Date:  11/24/2018  Time:  3:15 pm  Type of Therapy:  Psychoeducational Skills  Participation Level:  Active  Participation Quality:  Appropriate  Affect:  Appropriate  Cognitive:  Appropriate  Insight:  Appropriate  Engagement in Group:  Engaged  Modes of Intervention:  Education  Summary of Progress/Problems:  Patient was alert, oriented and participated appropriate in group.    Earline Mayotte 11/24/2018, 4:41 PM

## 2018-11-25 MED ORDER — HYDROCHLOROTHIAZIDE 12.5 MG PO CAPS
12.5000 mg | ORAL_CAPSULE | Freq: Every day | ORAL | Status: DC
  Administered 2018-11-25 – 2018-11-26 (×2): 12.5 mg via ORAL
  Filled 2018-11-25 (×6): qty 1

## 2018-11-25 MED ORDER — POTASSIUM CHLORIDE CRYS ER 10 MEQ PO TBCR
10.0000 meq | EXTENDED_RELEASE_TABLET | Freq: Every day | ORAL | Status: DC
  Administered 2018-11-25 – 2018-11-26 (×2): 10 meq via ORAL
  Filled 2018-11-25 (×2): qty 1
  Filled 2018-11-25: qty 7
  Filled 2018-11-25 (×2): qty 1

## 2018-11-25 MED ORDER — POTASSIUM CHLORIDE CRYS ER 10 MEQ PO TBCR
10.0000 meq | EXTENDED_RELEASE_TABLET | Freq: Once | ORAL | Status: AC
  Administered 2018-11-25: 10 meq via ORAL
  Filled 2018-11-25: qty 1

## 2018-11-25 NOTE — Plan of Care (Signed)
  Problem: Coping: Goal: Ability to interact with others will improve Outcome: Progressing Note:  Pt has interacted with peers and staff appropriately tonight.

## 2018-11-25 NOTE — Therapy (Signed)
Occupational Therapy Group Note  Date:  11/25/2018 Time:  11:45 AM  Group Topic/Focus:  Self Esteem Action Plan:   The focus of this group is to help patients create a plan to continue to build self-esteem after discharge.  Participation Level:  Active  Participation Quality:  Redirectable  Affect:  Blunted  Cognitive:  Appropriate  Insight: Improving  Engagement in Group:  Engaged  Modes of Intervention:  Activity, Discussion, Education and Socialization  Additional Comments:    S: "I am really short tempered"  O: OT tx with focus on self esteem building this date. Education given on definition of self esteem, with both causes of low and high self esteem identified. Activity given for pt to identify a positive/aspiring trait for each letter of the alphabet. Pt to work with peers to help complete activity and build positive thinking.   A: Pt presents to group with blunted affect, engaged and participatory throughout session. Pt engaged in self esteem explanation by contributing both positive and negative factors. Pt occasionally monopolizing and talking over others, very willing to talk about herself. Pt redirectable. A-Z activity completed with peer to brainstorm appropriate responses.  P: Education given on self esteem and how to improve this date. Handouts and activities given to help facilitate skills when reintegrating into community.   Dalphine Handing, MSOT, OTR/L Behavioral Health OT/ Acute Relief OT PHP Office: [phone]  Dalphine Handing 11/25/2018, 11:45 AM

## 2018-11-25 NOTE — Progress Notes (Signed)
Patient ID: Stacy Cooper, female   DOB: 05-Mar-1971, 48 y.o.   MRN: [id number]  Pt currently presents with a blunted affect and cooperative behavior. Pt reports to writer that their goal is to "figure out why my blood pressure is so high" Pt reports good sleep with current medication regimen.   Pt provided with medications per providers orders. Pt's labs and vitals were monitored throughout the night. Pt supported emotionally and encouraged to express concerns and questions. Pt educated on medications.  Pt's safety ensured with 15 minute and environmental checks. Pt currently denies SI/HI and A/V hallucinations. Pt verbally agrees to seek staff if SI/HI or A/VH occurs and to consult with staff before acting on any harmful thoughts. Will continue POC.

## 2018-11-25 NOTE — Progress Notes (Signed)
D: Pt was in her room upon initial approach.  She presents with depressed affect and mood.  She describes her day as "up and down."  Her goal is to "stay positive and stay focused on what I have to do when I get out of here."  Pt denies SI/HI, denies hallucinations, and reports pain from headache of 8/10.  She has been visible in milieu interacting with peers and staff appropriately.    A: Introduced self to pt.  Actively listened to pt and offered support and encouragement.  Medication administered per order.  Q15 minute safety checks maintained.  R: Pt is compliant with medication.  She verbally contracts for safety and reports she will inform staff of needs and concerns.  Will continue to monitor and assess.

## 2018-11-25 NOTE — Progress Notes (Signed)
D: Patient observed up and interactive in the milieu. Patient states her main concern tonight is getting her dental guard that her mother brought in. Patient states she grinds her teeth causing much discomfort to the point she is wearing it even while awake. Patient's affect anxious and depressed with congruent mood. Rates headache at an 8/10, no other physical complaints.   A: Medicated per orders, prn tylenol given for discomfort. Medication education provided. Level III obs in place for safety. Emotional support offered. Patient encouraged to complete Suicide Safety Plan before discharge. Encouraged to attend and participate in unit programming.   R: Patient verbalizes understanding of POC. On reassess, patient was asleep. Patient denies SI/HI/AVH and remains safe on level III obs. Will continue to monitor throughout the night.

## 2018-11-25 NOTE — Progress Notes (Signed)
[facility] MD Progress Note  11/25/2018 2:10 PM Stacy Cooper  MRN:  [id number] Subjective:  "I'm good. I'm fine."  Stacy Cooper found socializing in the dayroom. Reports improved mood. Improved sleep since she had her mouthguard from home last night. She gave her mother the list of resources for housing from the CSW during visitation last night. States her mother has made some phone calls and expects for them to have housing by the end of the month. She plans to return to the home with her husband and stepdaughter (where her mother and son also live) until they can find alternate housing. Denies SI/HI toward husband/stepdaughter at this time. States she needs to stay strong for her son so that he will have a better place to live. States she will go for a walk outside the house and/or talk to friends if she gets angry with her stepdaughter again. Appears to minimize concerns about conflict with stepdaughter and says they will not hurt each other. States she will stay married to her husband, but they will live in different houses so she can be away from the stepdaughter. Blood pressure remains elevated since admit (140/99, hr 65 this morning).  Per CSW, patient's mother stated patient's husband has not spoken with her during hospitalization. Patient's mother expressed concerns about her discharging to home environment with stepdaughter. Patient's mother to discuss discharge plan with husband today.    From admission H&P: 48 year old female. Presented to ED via [employer], whom she reportedly called herself. On day of admission she had an argument with stepdaughter which escalated .Patient states she felt " like really hurting her if I could". On admission reported homicidal ideations towards stepdaughter and husband. Reports increased depression related in part to family tension. Reports increased tension at home after an adult stepdaughter moved in several weeks ago, and feels her husband has been unsupportive,  taking daughter's side rather than hers. On day of admission she had an argument with stepdaughter which escalated. In addition to anger towards husband, stepdaughter also endorses depression, sadness, but denies any suicidal ideations. Endorses neuro-vegetative symptoms , including significant anhedonia.Admission BAL negative,UDS positive for Cannabis  Principal Problem: Severe major depression, single episode, without psychotic features (HCC) Diagnosis: Principal Problem:   Severe major depression, single episode, without psychotic features (HCC)  Total Time spent with patient: 20 minutes  Past Psychiatric History: See admission H&P  Past Medical History:  Past Medical History:  Diagnosis Date  . Anemia     Past Surgical History:  Procedure Laterality Date  . CESAREAN SECTION    . TUBAL LIGATION     Family History: History reviewed. No pertinent family history. Family Psychiatric  History: See admission H&P Social History:  Social History   Substance and Sexual Activity  Alcohol Use No     Social History   Substance and Sexual Activity  Drug Use No    Social History   Socioeconomic History  . Marital status: Married    Spouse name: Not on file  . Number of children: Not on file  . Years of education: Not on file  . Highest education level: Not on file  Occupational History  . Not on file  Social Needs  . Financial resource strain: Not hard at all  . Food insecurity:    Worry: Never true    Inability: Never true  . Transportation needs:    Medical: No    Non-medical: No  Tobacco Use  . Smoking status: Current Every  Day Smoker    Packs/day: 0.25    Types: Cigarettes  . Smokeless tobacco: Never Used  Substance and Sexual Activity  . Alcohol use: No  . Drug use: No  . Sexual activity: Yes    Birth control/protection: None  Lifestyle  . Physical activity:    Days per week: 0 days    Minutes per session: 0 min  . Stress: Very much  Relationships  .  Social connections:    Talks on phone: Once a week    Gets together: Once a week    Attends religious service: 1 to 4 times per year    Active member of club or organization: No    Attends meetings of clubs or organizations: Not on file    Relationship status: Married  Other Topics Concern  . Not on file  Social History Narrative  . Not on file   Additional Social History:    Pain Medications: (P) Please see MAR Prescriptions: (P) Please see MAR Over the Counter: (P) Please see MAR History of alcohol / drug use?: (P) Yes Longest period of sobriety (when/how long): (P) 2 weeks Name of Substance 1: Marijuana 1 - Age of First Use: 48 years old 1 - Amount (size/oz): Unknown 1 - Frequency: Unknown 1 - Duration: 20 years off-and-on 1 - Last Use / Amount: 2 weeks ago                  Sleep: Good  Appetite:  Good  Current Medications: Current Facility-Administered Medications  Medication Dose Route Frequency Provider Last Rate Last Dose  . acetaminophen (TYLENOL) tablet 650 mg  650 mg Oral Q6H PRN Nira Conn A, NP   650 mg at 11/25/18 1103  . alum & mag hydroxide-simeth (MAALOX/MYLANTA) 200-200-20 MG/5ML suspension 30 mL  30 mL Oral Q4H PRN Nira Conn A, NP      . feeding supplement (ENSURE ENLIVE) (ENSURE ENLIVE) liquid 237 mL  237 mL Oral BID BM Malvin Johns, MD   237 mL at 11/25/18 0945  . hydrochlorothiazide (MICROZIDE) capsule 12.5 mg  12.5 mg Oral Daily Antonieta Pert, MD   12.5 mg at 11/25/18 1217  . hydrOXYzine (ATARAX/VISTARIL) tablet 25 mg  25 mg Oral TID PRN Jackelyn Poling, NP      . magnesium hydroxide (MILK OF MAGNESIA) suspension 30 mL  30 mL Oral Daily PRN Nira Conn A, NP      . mirtazapine (REMERON) tablet 7.5 mg  7.5 mg Oral QHS Cobos, Rockey Situ, MD   7.5 mg at 11/24/18 2201  . pneumococcal 23 valent vaccine (PNU-IMMUNE) injection 0.5 mL  0.5 mL Intramuscular Tomorrow-1000 Cobos, Fernando A, MD      . potassium chloride (K-DUR,KLOR-CON) CR tablet  10 mEq  10 mEq Oral Daily Antonieta Pert, MD   10 mEq at 11/25/18 1217    Lab Results: No results found for this or any previous visit (from the past 48 hour(s)).  Blood Alcohol level:  Lab Results  Component Value Date   ETH <10 11/22/2018    Metabolic Disorder Labs: Lab Results  Component Value Date   HGBA1C 5.5 11/23/2018   MPG 111.15 11/23/2018   No results found for: PROLACTIN Lab Results  Component Value Date   CHOL 139 11/23/2018   TRIG 84 11/23/2018   HDL 50 11/23/2018   CHOLHDL 2.8 11/23/2018   VLDL 17 11/23/2018   LDLCALC 72 11/23/2018    Physical Findings: AIMS: Facial and Oral Movements  Muscles of Facial Expression: None, normal Lips and Perioral Area: None, normal Jaw: None, normal Tongue: None, normal,Extremity Movements Upper (arms, wrists, hands, fingers): None, normal Lower (legs, knees, ankles, toes): None, normal, Trunk Movements Neck, shoulders, hips: None, normal, Overall Severity Severity of abnormal movements (highest score from questions above): None, normal Incapacitation due to abnormal movements: None, normal Patient's awareness of abnormal movements (rate only patient's report): No Awareness, Dental Status Current problems with teeth and/or dentures?: No Does patient usually wear dentures?: No  CIWA:  CIWA-Ar Total: 1 COWS:  COWS Total Score: 0  Musculoskeletal: Strength & Muscle Tone: within normal limits Gait & Station: normal Patient leans: N/A  Psychiatric Specialty Exam: Physical Exam  Nursing note and vitals reviewed. Constitutional: She is oriented to person, place, and time. She appears well-developed and well-nourished.  Cardiovascular: Normal rate.  Respiratory: Effort normal.  Neurological: She is alert and oriented to person, place, and time.    Review of Systems  Constitutional: Negative.   Respiratory: Negative.   Cardiovascular: Negative.   Psychiatric/Behavioral: Positive for substance abuse (UDS (+) THC).  Negative for depression, hallucinations, memory loss and suicidal ideas. The patient is not nervous/anxious and does not have insomnia.     Blood pressure 130/78, pulse 72, temperature 98.8 F (37.1 C), temperature source Oral, resp. rate 16, height 5\' 5"  (1.651 m), weight 69.9 kg, last menstrual period 10/29/2018, SpO2 100 %.Body mass index is 25.63 kg/m.  General Appearance: Fairly Groomed  Eye Contact:  Good  Speech:  Normal Rate  Volume:  Normal  Mood:  Euthymic  Affect:  Congruent  Thought Process:  Coherent  Orientation:  Full (Time, Place, and Person)  Thought Content:  WDL  Suicidal Thoughts:  denies  Homicidal Thoughts:  denies  Memory:  Immediate;   Good  Judgement:  Fair  Insight:  Fair  Psychomotor Activity:  Normal  Concentration:  Concentration: Good  Recall:  Good  Fund of Knowledge:  Fair  Language:  Good  Akathisia:  No  Handed:  Right  AIMS (if indicated):     Assets:  Communication Skills Desire for Improvement Physical Health Social Support  ADL's:  Intact  Cognition:  WNL  Sleep:  Number of Hours: 6.75     Treatment Plan Summary: Daily contact with patient to assess and evaluate symptoms and progress in treatment and Medication management   Continue inpatient hospitalization.  Continue Remeron 7.5 mg PO QHS for mood/sleep  HCTZ 12.5 mg PO daily started for high blood pressures with K-Dur 10 mEq (K 3.3)  Patient will participate in the therapeutic group milieu.  Discharge disposition in progress.   Aldean BakerJanet E Jeovany Huitron, NP 11/25/2018, 2:10 PM

## 2018-11-25 NOTE — [facility] Suicide Risk Assessment (Signed)
[facility] INPATIENT:  Family/Significant Other Suicide Prevention Education  Suicide Prevention Education:  Education Completed; mother, Stacy Cooper, [phone]) has been identified by the patient as the family member/significant other with whom the patient will be residing, and identified as the person(s) who will aid the patient in the event of a mental health crisis (suicidal ideations/suicide attempt).  With written consent from the patient, the family member/significant other has been provided the following suicide prevention education, prior to the and/or following the discharge of the patient.  The suicide prevention education provided includes the following:  Suicide risk factors  Suicide prevention and interventions  National Suicide Hotline telephone number  [facility] assessment telephone number  [facility] 911  [facility] and/or Residential Mobile Crisis [facility] telephone number  Request made of family/significant other to:  Remove weapons (e.g., guns, rifles, knives), all items previously/currently identified as safety concern.    Remove drugs/medications (over-the-counter, prescriptions, illicit drugs), all items previously/currently identified as a safety concern.  The family member/significant other verbalizes understanding of the suicide prevention education information provided. The family member/significant other agrees to remove the items of safety concern listed above.  CSW spoke with patient's mother, Stacy Cooper. Stacy Cooper resides in the home with patient, patient's husband, and patient's stepdaughter. Stacy Cooper is aware of plan for her, patient, and patient's 47 year old son to eventually get their own place; with patient returning home in the meantime.  Stacy Cooper states she doesn't feel the patient is quite ready to discharge, given the stressful home environment she would be returning to and given the uncertainty and stress related to going  back to work.   When asked about safety concerns, Stacy Cooper says "I don't know if she'd do anything (to step-daughter or husband)."  Stacy reportedly hasn't discussed discharge with patient's husband or the possibility of the patient returning home soon. CSW asked Stacy Cooper, if she is comfortable, to mention this to the patient's husband, so the family can be prepared for the patient's discharge and temporary return home.  Psychiatry informed.  Stacy Cooper 11/25/2018, 10:29 AM

## 2018-11-25 NOTE — Progress Notes (Signed)
CSW made a second call to patient's mother, Penelope Galas, [phone]). Dwana Curd states the patient's husband has not come home from work yet, as such, she hasn't been able to discuss discharge with him or the possibility of the patient returning to the home temporarily.  CSW will follow up tomorrow.  Enid Cutter, LCSW-A Clinical Social Worker

## 2018-11-25 NOTE — [facility] Group Notes (Signed)
Adult Psychoeducational Group Note  Date:  11/25/2018 Time:  9:17 PM  Group Topic/Focus:  Wrap-Up Group:   The focus of this group is to help patients review their daily goal of treatment and discuss progress on daily workbooks.  Participation Level:  Active  Participation Quality:  Appropriate and Attentive  Affect:  Appropriate  Cognitive:  Alert and Appropriate  Insight: Appropriate and Good  Engagement in Group:  Engaged  Modes of Intervention:  Discussion and Education  Additional Comments:  Pt attended and participated in wrap up group this evening. Pt rated their day a 5/10, due to their day being "up and down". Pt blood pressure has been up and down, having headaches, and cramps. Outside of the physical pain, pt has had a good day. Pt completed their goal, which was to stay focused and to try not to worry about their blood pressure.   Chrisandra Netters 11/25/2018, 9:17 PM

## 2018-11-25 NOTE — Progress Notes (Addendum)
Patient ID: Stacy Cooper, female   DOB: Apr 16, 1971, 48 y.o.   MRN: [id number]  Pt blood pressure is trending high throughout admission. Pt given first dose of HTN medication this afternoon. MAP percentage gaps change frequently during multiple readings this evening. Pt reports muscle cramping to writer this evening. No current EKG on file. MD called. Verbal order to give a repeat 10 mEqs of KDUR. Will report to oncoming RN and continue to monitor patient.

## 2018-11-26 MED ORDER — ALBUTEROL SULFATE HFA 108 (90 BASE) MCG/ACT IN AERS: 2.0000 | INHALATION_SPRAY | Freq: Four times a day (QID) | RESPIRATORY_TRACT | Status: DC | PRN

## 2018-11-26 MED ORDER — POTASSIUM CHLORIDE CRYS ER 10 MEQ PO TBCR: 10.0000 meq | EXTENDED_RELEASE_TABLET | Freq: Every day | ORAL | 0 refills | Status: DC

## 2018-11-26 MED ORDER — HYDROCHLOROTHIAZIDE 12.5 MG PO CAPS: 12.5000 mg | ORAL_CAPSULE | Freq: Every day | ORAL | 0 refills | Status: DC

## 2018-11-26 MED ORDER — HYDROXYZINE HCL 25 MG PO TABS: 25.0000 mg | ORAL_TABLET | Freq: Three times a day (TID) | ORAL | 0 refills | Status: DC | PRN

## 2018-11-26 MED ORDER — MIRTAZAPINE 7.5 MG PO TABS: 7.5000 mg | ORAL_TABLET | Freq: Every day | ORAL | 0 refills | Status: DC

## 2018-11-26 NOTE — [facility] Suicide Risk Assessment (Signed)
[facility] Discharge Suicide Risk Assessment   Principal Problem: Severe major depression, single episode, without psychotic features (HCC) Discharge Diagnoses: Principal Problem:   Severe major depression, single episode, without psychotic features (HCC)   Total Time spent with patient: 20 minutes  Musculoskeletal: Strength & Muscle Tone: within normal limits Gait & Station: normal Patient leans: N/A  Psychiatric Specialty Exam: Review of Systems  All other systems reviewed and are negative.   Blood pressure 137/87, pulse 77, temperature 98.8 F (37.1 C), temperature source Oral, resp. rate 16, height 5\' 5"  (1.651 m), weight 69.9 kg, last menstrual period 10/29/2018, SpO2 100 %.Body mass index is 25.63 kg/m.  General Appearance: Casual  Eye Contact::  Good  Speech:  Normal Rate409  Volume:  Normal  Mood:  Euthymic  Affect:  Congruent  Thought Process:  Coherent and Descriptions of Associations: Intact  Orientation:  Full (Time, Place, and Person)  Thought Content:  Logical  Suicidal Thoughts:  No  Homicidal Thoughts:  No  Memory:  Immediate;   Fair Recent;   Fair Remote;   Fair  Judgement:  Intact  Insight:  Fair  Psychomotor Activity:  Normal  Concentration:  Good  Recall:  Good  Fund of Knowledge:Good  Language: Good  Akathisia:  Negative  Handed:  Right  AIMS (if indicated):     Assets:  Communication Skills Desire for Improvement Housing Physical Health Resilience Talents/Skills  Sleep:  Number of Hours: 5.75  Cognition: WNL  ADL's:  Intact   Mental Status Per Nursing Assessment::   On Admission:  Suicidal ideation indicated by patient  Demographic Factors:  Unemployed  Loss Factors: NA  Historical Factors: Impulsivity  Risk Reduction Factors:   Responsible for children under 64 years of age, Sense of responsibility to family, Living with another person, especially a relative and Positive social support  Continued Clinical Symptoms:  Depression:    Impulsivity  Cognitive Features That Contribute To Risk:  None    Suicide Risk:  Minimal: No identifiable suicidal ideation.  Patients presenting with no risk factors but with morbid ruminations; may be classified as minimal risk based on the severity of the depressive symptoms  Follow-up Information    [facility]. Go on 12/02/2018.   Why:  Your hospital follow up appointment is Wednesday, 1/22 at 8:30a. Please bring: photo ID, proof of insurance, SSN, and discharge paperwork from this hospitalization.  Contact information: [address] [place] [address] [phone]           Plan Of Care/Follow-up recommendations:  Activity:  ad lib  Antonieta Pert, MD 11/26/2018, 8:59 AM

## 2018-11-26 NOTE — Progress Notes (Addendum)
Discharge Note:  Patient discharged home with bus ticket.  Patient denied SI and HI.  Denied A/V hallucinations.  Suicide prevention information given and discussed with patient who stated she understood and had no questions.   My3 app information given to patient also.  Survey given to patient.   Patient stated she received all her belongings, clothing, toiletries, misc items, etc.  Patient stated she appreciated all assistance received from [facility] staff.  All required discharge information given to patient at discharge.

## 2018-11-26 NOTE — Progress Notes (Signed)
D:  Patient's self inventory sheet, patient sleeps good, sleep medication helpful.  Good appetite, high energy level, good concentration.  Denied depression, hopeless and anxiety.  Denied withdrawals.  Denied SI.  Physical problems, headaches.  Physical pain, worst pain #6, mouth.  Pain medicine helpful.  Goal is discharge.  Plans to talk to MD/SW.  Does have discharge plans. A:  Medications administered per MD orders.  Emotional support and encouragement given patient. R:  Denied SI and HI, contracts for safety.  Denied A/V hallucinations.  Safety maintained with 15 minute checks.

## 2018-11-26 NOTE — Progress Notes (Addendum)
CSW briefly spoke with patient's mother, Dwana Curd Dollar,([phone]). Mother is curious is patient can discharge today. Mother states she has no safety concerns at this time. CSW asked if mother had discussed discharge and living arrangements with patient's husband. Mother states she has not spoken with patient's husband yet but does not anticipate issues.    Enid Cutter, LCSW-A Clinical Social Worker

## 2018-11-26 NOTE — Progress Notes (Signed)
  [facility] Adult Case Management Discharge Plan :  Will you be returning to the same living situation after discharge:  Yes,  Home with family. At discharge, do you have transportation home?: Yes,  bus pass Do you have the ability to pay for your medications: Yes,  income from employment  Release of information consent forms completed and in the chart; work letters and bus pass on chart.  Patient to Follow up at: Follow-up Information    [facility]. Go on 12/02/2018.   Why:  Your hospital follow up appointment is Wednesday, 1/22 at 8:30a. Please bring: photo ID, proof of insurance, SSN, and discharge paperwork from this hospitalization.  Contact information: [address] [place] [address] [phone]           Next level of care provider has access to [facility] Link:no  Safety Planning and Suicide Prevention discussed: Yes,  with mom  Have you used any form of tobacco in the last 30 days? (Cigarettes, Smokeless Tobacco, Cigars, and/or Pipes): Yes  Has patient been referred to the Quitline?: Patient refused referral  Patient has been referred for addiction treatment: Yes  Darreld Mclean, LCSWA 11/26/2018, 9:03 AM

## 2018-11-26 NOTE — Discharge Summary (Signed)
Physician Discharge Summary Note  Patient:  Stacy Cooper is an 48 y.o., female  MRN:  [id number]  DOB:  02-07-1971  Patient phone:  [phone] (home)   Patient address:   [address] [place] [address],   Total Time spent with patient: Greater than 30 minutes  Date of Admission:  11/22/2018  Date of Discharge: 11-26-18  Reason for Admission: Homicidal ideations towards stepdaughter and husband.   Principal Problem: Severe major depression, single episode, without psychotic features [facility])  Discharge Diagnoses: Principal Problem:   Severe major depression, single episode, without psychotic features [facility])  Past Psychiatric History: Major depression.  Past Medical History:  Past Medical History:  Diagnosis Date  . Anemia     Past Surgical History:  Procedure Laterality Date  . CESAREAN SECTION    . TUBAL LIGATION     Family History: History reviewed. No pertinent family history.  Family Psychiatric  History: See H&P  Social History:  Social History   Substance and Sexual Activity  Alcohol Use No     Social History   Substance and Sexual Activity  Drug Use No    Social History   Socioeconomic History  . Marital status: Married    Spouse name: Not on file  . Number of children: Not on file  . Years of education: Not on file  . Highest education level: Not on file  Occupational History  . Not on file  Social Needs  . Financial resource strain: Not hard at all  . Food insecurity:    Worry: Never true    Inability: Never true  . Transportation needs:    Medical: No    Non-medical: No  Tobacco Use  . Smoking status: Current Every Day Smoker    Packs/day: 0.25    Types: Cigarettes  . Smokeless tobacco: Never Used  Substance and Sexual Activity  . Alcohol use: No  . Drug use: No  . Sexual activity: Yes    Birth control/protection: None  Lifestyle  . Physical activity:    Days per week: 0 days    Minutes per session: 0  min  . Stress: Very much  Relationships  . Social connections:    Talks on phone: Once a week    Gets together: Once a week    Attends religious service: 1 to 4 times per year    Active member of club or organization: No    Attends meetings of clubs or organizations: Not on file    Relationship status: Married  Other Topics Concern  . Not on file  Social History Narrative  . Not on file   Hospital Course: (Per Md's admission evaluation): 48 year old female. Presented to ED via [employer], whom she reportedly called herself. On day of admission she had an argument with stepdaughter which escalated .Patient states she felt " like really hurting her if I could". On admission reported homicidal ideations towards stepdaughter and husband. Reports increased depression related in part to family tension. Reports increased tension at home after an adult stepdaughter moved in several weeks ago, and feels her husband has been unsupportive, taking daughter's side rather than hers. On day of admission she had an argument with stepdaughter which escalated. In addition to anger towards husband, stepdaughter also endorses depression, sadness, but denies any suicidal ideations. Endorses neuro-vegetative symptoms , including significant anhedonia. Admission BAL negative,UDS positive for Cannabis.  Lanora Manis was [facility] to the [facility] adult unit for crisis management due to worsening  symptoms of Bipolar disorder with psychotic features. Patient has hx of Bipolar affective disorder. She was also known to be non-compliant to her treatment regimen. She was in need of mood stabilization treatments.   After evaluation of her presenting symptoms, Caileen was started on the medication regimen for her presenting presenting symptoms. Their indications & side effects were explained to her. She received & was discharged on all the medications used to stabilize her symptoms as listed below. Her other pre-existing medical problems were  identified & treated accordingly by resuming all her pertinent home medications as deemed appropriate.   During the course of her hosptalization, Nishka's improvement was monitored by observation & her daily report of symptom reduction noted.  Her emotional & mental status were monitored by daily self-inventory reports completed by her & the clinical staff. She reported continued improvement on daily basis & denied any new concerns. She was enrolled & encouraged to attend group seesions to help with recognizing triggers of her emotional crises & ways to cope better with them.         Chidera was evaluated by the treatment team on daily basis for mood stability and plans for continued recovery after discharge. She was offered further treatment options upon discharge on an outpatient basis as noted below. She was encouraged to maintain satisfactory support network and home environment as this will aid in maintaining mood stability. She was instructed & encouraged to adhere to her medication regimen as recommended by her treatment team.    Unika was seen this morning by the attending psychiatrist. Her reason for admission, treatment plans & response to treatment discussed. She endorsed that she is doing well & ready to be discharged to continue mental health care on an outpatient basis as noted below. She was provided with all the necessary information needed to make this appointment without problems. Upon discharge, She presents both mentally and medically stable. She denies any suicidal/homicidal ideations, auditory/visual/tactile hallucinations, delusional thoughts & or paranoia. She was able to engage in safety planning including [facility] or contact emergency services if she feels unable to maintain her own safety or the safety of others. Pt had no further questions, comments or concerns. [facility] in no distress.   Physical Findings: AIMS: Facial and Oral  Movements Muscles of Facial Expression: None, normal Lips and Perioral Area: None, normal Jaw: None, normal Tongue: None, normal,Extremity Movements Upper (arms, wrists, hands, fingers): None, normal Lower (legs, knees, ankles, toes): None, normal, Trunk Movements Neck, shoulders, hips: None, normal, Overall Severity Severity of abnormal movements (highest score from questions above): None, normal Incapacitation due to abnormal movements: None, normal Patient's awareness of abnormal movements (rate only patient's report): No Awareness, Dental Status Current problems with teeth and/or dentures?: No Does patient usually wear dentures?: No  CIWA:  CIWA-Ar Total: 1 COWS:  COWS Total Score: 0  Musculoskeletal: Strength & Muscle Tone: within normal limits Gait & Station: normal Patient leans: N/A  Psychiatric Specialty Exam: Physical Exam  Nursing note and vitals reviewed. Constitutional: She is oriented to person, place, and time. She appears well-developed.  HENT:  Head: Normocephalic.  Eyes: Pupils are equal, round, and reactive to light.  Neck: Normal range of motion.  Cardiovascular: Normal rate.  Respiratory: Effort normal.  GI: Soft.  Genitourinary:    Genitourinary Comments: Deferred   Musculoskeletal: Normal range of motion.  Neurological: She is alert and oriented to person, place, and time.  Skin: Skin  is warm and dry.    Review of Systems  Constitutional: Negative.   HENT: Negative.   Eyes: Negative.   Respiratory: Negative for cough.   Cardiovascular: Negative.  Negative for chest pain and palpitations.  Gastrointestinal: Negative.  Negative for abdominal pain, heartburn, nausea and vomiting.  Genitourinary: Negative.   Musculoskeletal: Negative.   Skin: Negative.   Neurological: Negative.  Negative for dizziness and headaches.  Endo/Heme/Allergies: Negative.   Psychiatric/Behavioral: Positive for depression (Stabilized with medication prior to discharge) and  substance abuse (Hx. THC use disorder). Negative for hallucinations, memory loss and suicidal ideas. The patient has insomnia (Stable). The patient is not nervous/anxious (Stable).     Blood pressure 137/87, pulse 77, temperature 98.8 F (37.1 C), temperature source Oral, resp. rate 16, height 5\' 5"  (1.651 m), weight 69.9 kg, last menstrual period 10/29/2018, SpO2 100 %.Body mass index is 25.63 kg/m.  See Md's discharge SRA   Have you used any form of tobacco in the last 30 days? (Cigarettes, Smokeless Tobacco, Cigars, and/or Pipes): Yes  Has this patient used any form of tobacco in the last 30 days? (Cigarettes, Smokeless Tobacco, Cigars, and/or Pipes): N/A  Blood Alcohol level:  Lab Results  Component Value Date   ETH <10 11/22/2018   Metabolic Disorder Labs:  Lab Results  Component Value Date   HGBA1C 5.5 11/23/2018   MPG 111.15 11/23/2018   No results found for: PROLACTIN Lab Results  Component Value Date   CHOL 139 11/23/2018   TRIG 84 11/23/2018   HDL 50 11/23/2018   CHOLHDL 2.8 11/23/2018   VLDL 17 11/23/2018   LDLCALC 72 11/23/2018   See Psychiatric Specialty Exam and Suicide Risk Assessment completed by Attending Physician prior to discharge.  Discharge destination:  Home  Is patient on multiple antipsychotic therapies at discharge:  No   Has Patient had three or more failed trials of antipsychotic monotherapy by history:  No  Recommended Plan for Multiple Antipsychotic Therapies: NA  Allergies as of 11/26/2018      Reactions   Codeine Nausea And Vomiting   Shellfish Allergy Itching, Swelling   Prednisone Rash   Causes H/A      Medication List    STOP taking these medications   aspirin-acetaminophen-caffeine 250-250-65 MG tablet Commonly known as:  EXCEDRIN MIGRAINE     TAKE these medications     Indication  albuterol 108 (90 Base) MCG/ACT inhaler Commonly known as:  PROVENTIL HFA;VENTOLIN HFA Inhale 2 puffs into the lungs every 6 (six) hours as  needed for wheezing or shortness of breath. What changed:  how much to take  Indication:  Asthma   hydrochlorothiazide 12.5 MG capsule Commonly known as:  MICROZIDE Take 1 capsule (12.5 mg total) by mouth daily. For high blood pressure Start taking on:  November 27, 2018  Indication:  High Blood Pressure Disorder   hydrOXYzine 25 MG tablet Commonly known as:  ATARAX/VISTARIL Take 1 tablet (25 mg total) by mouth 3 (three) times daily as needed for anxiety.  Indication:  Feeling Anxious   mirtazapine 7.5 MG tablet Commonly known as:  REMERON Take 1 tablet (7.5 mg total) by mouth at bedtime. For sleep  Indication:  Major Depressive Disorder   potassium chloride 10 MEQ tablet Commonly known as:  K-DUR,KLOR-CON Take 1 tablet (10 mEq total) by mouth daily. For Potassium replacement Start taking on:  November 27, 2018  Indication:  Low Amount of Potassium in the Blood      Follow-up Information  [facility]. Go on 12/02/2018.   Why:  Your hospital follow up appointment is Wednesday, 1/22 at 8:30a. Please bring: photo ID, proof of insurance, SSN, and discharge paperwork from this hospitalization.  Contact information: [address] [place] [address] [phone]          Follow-up recommendations: Activity:  As tolerated Diet: As recommended by your primary care doctor. Keep all scheduled follow-up appointments as recommended.   Comments: Patient is instructed prior to discharge to: Take all medications as prescribed by his/her mental healthcare provider. Report any adverse effects and or reactions from the medicines to his/her outpatient provider promptly. Patient has been instructed & cautioned: To not engage in alcohol and or illegal drug use while on prescription medicines. In the event of worsening symptoms, patient is instructed to call the crisis hotline, 911 and or go to the nearest ED for appropriate evaluation and treatment of symptoms. To follow-up with his/her  primary care provider for your other medical issues, concerns and or health care needs.   Signed: Armandina Stammer, NP, PMHNP, FNP-BC 11/26/2018, 10:06 AM

## 2020-03-01 ENCOUNTER — Emergency Department (HOSPITAL_COMMUNITY)
Admission: EM | Admit: 2020-03-01 | Discharge: 2020-03-01 | Disposition: A | Discharge status: Home / Self Care | Payer: Self-pay | Attending: Emergency Medicine | Admitting: Emergency Medicine

## 2020-03-01 ENCOUNTER — Encounter (HOSPITAL_COMMUNITY): Payer: Self-pay

## 2020-03-01 ENCOUNTER — Other Ambulatory Visit: Payer: Self-pay

## 2020-03-01 DIAGNOSIS — R590 Localized enlarged lymph nodes: Secondary | ICD-10-CM | POA: Insufficient documentation

## 2020-03-01 DIAGNOSIS — Z79899 Other long term (current) drug therapy: Secondary | ICD-10-CM | POA: Insufficient documentation

## 2020-03-01 DIAGNOSIS — T50Z95A Adverse effect of other vaccines and biological substances, initial encounter: Secondary | ICD-10-CM | POA: Insufficient documentation

## 2020-03-01 DIAGNOSIS — F17210 Nicotine dependence, cigarettes, uncomplicated: Secondary | ICD-10-CM | POA: Insufficient documentation

## 2020-03-01 MED ORDER — NAPROXEN 500 MG PO TABS: 500.0000 mg | ORAL_TABLET | Freq: Two times a day (BID) | ORAL | 0 refills | Status: AC

## 2020-03-01 MED ORDER — HYDROCODONE-ACETAMINOPHEN 5-325 MG PO TABS
1.0000 | ORAL_TABLET | Freq: Once | ORAL | Status: AC
  Administered 2020-03-01: 1 via ORAL
  Filled 2020-03-01: qty 1

## 2020-03-01 MED ORDER — DIPHENHYDRAMINE HCL 25 MG PO CAPS
25.0000 mg | ORAL_CAPSULE | Freq: Once | ORAL | Status: AC
  Administered 2020-03-01: 25 mg via ORAL
  Filled 2020-03-01: qty 1

## 2020-03-01 NOTE — Discharge Instructions (Signed)
You are seen today for arm pain and swelling.  It appears that you have some swollen lymph nodes after the vaccine.  This is a normal vaccine reaction.  Although it is unpleasant and painful, it should subside in a week or so.  If you do develop a fever of 100.4 or higher or other additional symptoms please be reevaluated.

## 2020-03-01 NOTE — ED Provider Notes (Signed)
[facility] EMERGENCY DEPARTMENT Provider Note   CSN: [id number] Arrival date & time: 03/01/20  1812     History Chief Complaint  Patient presents with  . Arm Pain    Stacy Cooper is a 49 y.o. female.  Patient is a 49 year old female with past medical history of major depression presenting to the emergency department for right axillary and arm swelling and pain after receiving a second dose of factor COVID-19 vaccine on this past Sunday.  She received a shot in the right arm and a day or so later began to feel the symptoms in the same arm. she denies any fever, chills, nausea, vomiting, chest pain, shortness of breath, skin color changes        Past Medical History:  Diagnosis Date  . Anemia     Patient Active Problem List   Diagnosis Date Noted  . Severe major depression, single episode, without psychotic features ([facility]) 11/22/2018    Past Surgical History:  Procedure Laterality Date  . CESAREAN SECTION    . TUBAL LIGATION       OB History   No obstetric history on file.     No family history on file.  Social History   Tobacco Use  . Smoking status: Current Every Day Smoker    Packs/day: 0.25    Types: Cigarettes  . Smokeless tobacco: Never Used  Substance Use Topics  . Alcohol use: No  . Drug use: No    Home Medications Prior to Admission medications   Medication Sig Start Date End Date Taking? Authorizing Provider  albuterol (PROVENTIL HFA;VENTOLIN HFA) 108 (90 Base) MCG/ACT inhaler Inhale 2 puffs into the lungs every 6 (six) hours as needed for wheezing or shortness of breath. 11/26/18   Lindell Spar I, NP  hydrochlorothiazide (MICROZIDE) 12.5 MG capsule Take 1 capsule (12.5 mg total) by mouth daily. For high blood pressure 11/27/18   Lindell Spar I, NP  hydrOXYzine (ATARAX/VISTARIL) 25 MG tablet Take 1 tablet (25 mg total) by mouth 3 (three) times daily as needed for anxiety. 11/26/18   Lindell Spar I, NP  mirtazapine  (REMERON) 7.5 MG tablet Take 1 tablet (7.5 mg total) by mouth at bedtime. For sleep 11/26/18   Lindell Spar I, NP  naproxen (NAPROSYN) 500 MG tablet Take 1 tablet (500 mg total) by mouth 2 (two) times daily for 7 days. 03/01/20 03/08/20  Madilyn Hook A, PA-C  potassium chloride (K-DUR,KLOR-CON) 10 MEQ tablet Take 1 tablet (10 mEq total) by mouth daily. For Potassium replacement 11/27/18   Lindell Spar I, NP    Allergies    Codeine, Shellfish allergy, and Prednisone  Review of Systems   Review of Systems  Constitutional: Negative.   Respiratory: Negative for cough and shortness of breath.   Cardiovascular: Negative for chest pain.  Gastrointestinal: Negative for abdominal pain, nausea and vomiting.  Musculoskeletal: Positive for arthralgias and myalgias.  Skin: Negative for color change, rash and wound.  Allergic/Immunologic: Negative for immunocompromised state.  Neurological: Negative for light-headedness.  Hematological: Positive for adenopathy.    Physical Exam Updated Vital Signs BP (!) 157/76 (BP Location: Left Arm)   Pulse 74   Temp 98.8 F (37.1 C) (Oral)   Resp 16   Ht 5\' 6"  (1.676 m)   Wt 92.5 kg   SpO2 100%   BMI 32.93 kg/m   Physical Exam Vitals and nursing note reviewed.  Constitutional:      General: She is not in acute distress.  Appearance: Normal appearance. She is not ill-appearing, toxic-appearing or diaphoretic.  HENT:     Head: Normocephalic.  Eyes:     Conjunctiva/sclera: Conjunctivae normal.  Pulmonary:     Effort: Pulmonary effort is normal.  Musculoskeletal:     Comments: Tenderness to palpation in the right deltoid and biceps muscle.  There is swelling in the axillary area with positive lymphadenopathy  Skin:    General: Skin is dry.     Capillary Refill: Capillary refill takes less than 2 seconds.     Coloration: Skin is not jaundiced or pale.     Findings: No bruising, erythema, lesion or rash.  Neurological:     Mental Status: She is  alert.  Psychiatric:        Mood and Affect: Mood normal.     ED Results / Procedures / Treatments   Labs (all labs ordered are listed, but only abnormal results are displayed) Labs Reviewed - No data to display  EKG None  Radiology No results found.  Procedures Procedures (including critical care time)  Medications Ordered in ED Medications  HYDROcodone-acetaminophen (NORCO/VICODIN) 5-325 MG per tablet 1 tablet (1 tablet Oral Given 03/01/20 2056)  diphenhydrAMINE (BENADRYL) capsule 25 mg (25 mg Oral Given 03/01/20 2101)    ED Course  I have reviewed the triage vital signs and the nursing notes.  Pertinent labs & imaging results that were available during my care of the patient were reviewed by me and considered in my medical decision making (see chart for details).    MDM Rules/Calculators/A&P                      Based on review of vitals, medical screening exam, lab work and/or imaging, there does not appear to be an acute, emergent etiology for the patient's symptoms. Counseled pt on good return precautions and encouraged both PCP and ED follow-up as needed.  Prior to discharge, I also discussed incidental imaging findings with patient in detail and advised appropriate, recommended follow-up in detail.  Clinical Impression: 1. Axillary lymphadenopathy   2. Adverse effect of vaccine, initial encounter     Disposition: Discharge  Prior to providing a prescription for a controlled substance, I independently reviewed the patient's recent prescription history on the [place] Controlled Substance Reporting System. The patient had no recent or regular prescriptions and was deemed appropriate for a brief, less than 3 day prescription of narcotic for acute analgesia.  This note was prepared with assistance of Conservation officer, historic buildings. Occasional wrong-word or sound-a-like substitutions may have occurred due to the inherent limitations of voice recognition  software.  Final Clinical Impression(s) / ED Diagnoses Final diagnoses:  Axillary lymphadenopathy  Adverse effect of vaccine, initial encounter    Rx / DC Orders ED Discharge Orders         Ordered    naproxen (NAPROSYN) 500 MG tablet  2 times daily     03/01/20 2042           Jeral Pinch 03/01/20 2109    Mancel Bale, MD 03/01/20 2340

## 2020-03-01 NOTE — ED Triage Notes (Signed)
Pt reports after getting her second COVID vaccine on Sunday her right arm has been more painful, swollen underneath her axilla area. Denies redness, just swelling

## 2021-05-03 ENCOUNTER — Other Ambulatory Visit: Payer: Self-pay | Admitting: Internal Medicine

## 2021-05-03 DIAGNOSIS — Z1231 Encounter for screening mammogram for malignant neoplasm of breast: Secondary | ICD-10-CM

## 2021-06-26 ENCOUNTER — Ambulatory Visit: Payer: Medicaid Other

## 2021-07-05 ENCOUNTER — Ambulatory Visit: Payer: Medicaid Other

## 2021-10-03 ENCOUNTER — Encounter (HOSPITAL_COMMUNITY): Payer: Self-pay

## 2021-10-03 ENCOUNTER — Other Ambulatory Visit: Payer: Self-pay

## 2021-10-03 ENCOUNTER — Ambulatory Visit (HOSPITAL_COMMUNITY)
Admission: EM | Admit: 2021-10-03 | Discharge: 2021-10-03 | Disposition: A | Discharge status: Home / Self Care | Payer: 59 | Attending: Internal Medicine | Admitting: Internal Medicine

## 2021-10-03 DIAGNOSIS — M5412 Radiculopathy, cervical region: Secondary | ICD-10-CM

## 2021-10-03 MED ORDER — HYDROCODONE-ACETAMINOPHEN 5-325 MG PO TABS: 1.0000 | ORAL_TABLET | Freq: Four times a day (QID) | ORAL | 0 refills | Status: DC | PRN

## 2021-10-03 MED ORDER — METHOCARBAMOL 500 MG PO TABS: 500.0000 mg | ORAL_TABLET | Freq: Every evening | ORAL | 0 refills | Status: DC | PRN

## 2021-10-03 MED ORDER — MELOXICAM 7.5 MG PO TABS: 7.5000 mg | ORAL_TABLET | Freq: Every day | ORAL | 0 refills | Status: DC

## 2021-10-03 MED ORDER — KETOROLAC TROMETHAMINE 60 MG/2ML IM SOLN
60.0000 mg | Freq: Once | INTRAMUSCULAR | Status: AC
  Administered 2021-10-03: 60 mg via INTRAMUSCULAR

## 2021-10-03 MED ORDER — KETOROLAC TROMETHAMINE 60 MG/2ML IM SOLN
INTRAMUSCULAR | Status: AC
  Filled 2021-10-03: qty 2

## 2021-10-03 NOTE — Discharge Instructions (Addendum)
Gentle range of motion exercises Take medications as prescribed Heating pad use only 20 minutes on-20 minutes off cycle sessions a day.

## 2021-10-03 NOTE — ED Provider Notes (Addendum)
[facility]    CSN: [id number] Arrival date & time: 10/03/21  1226      History   Chief Complaint Chief Complaint  Patient presents with   Neck Pain    HPI Stacy Cooper is a 50 y.o. female comes to the urgent care with few days history of left-sided neck and shoulder pain.  Patient's symptoms started about 2 days ago and has been persistent.  Pain is throbbing, on the left side of the neck, radiates to the left shoulder and left upper extremity.  Is aggravated by palpation of the left side of the neck.  No known relieving factors.  Patient has been using heating pad over prolonged periods of time.  She denies any weakness in the left upper extremity.  No trauma to the neck.  She has a history of cervical spine stenosis.  Pain is associated with numbness in the left upper extremity.  No trauma to the neck.  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      Home Medications    Prior to Admission medications   Medication Sig Start Date End Date Taking? Authorizing Provider  HYDROcodone-acetaminophen (NORCO/VICODIN) 5-325 MG tablet Take 1 tablet by mouth every 6 (six) hours as needed. 10/03/21  Yes Woodford Strege, Britta Mccreedy, MD  meloxicam (MOBIC) 7.5 MG tablet Take 1 tablet (7.5 mg total) by mouth daily. 10/03/21  Yes Florette Thai, Britta Mccreedy, MD  methocarbamol (ROBAXIN) 500 MG tablet Take 1 tablet (500 mg total) by mouth at bedtime as needed for muscle spasms. 10/03/21  Yes Shiesha Jahn, Britta Mccreedy, MD  albuterol (PROVENTIL HFA;VENTOLIN HFA) 108 (90 Base) MCG/ACT inhaler Inhale 2 puffs into the lungs every 6 (six) hours as needed for wheezing or shortness of breath. 11/26/18   Armandina Stammer I, NP  hydrochlorothiazide (MICROZIDE) 12.5 MG capsule Take 1 capsule (12.5 mg total) by mouth daily. For high blood pressure 11/27/18   Armandina Stammer I, NP  hydrOXYzine (ATARAX/VISTARIL) 25 MG tablet Take 1 tablet (25 mg total) by mouth 3 (three) times daily as needed for anxiety. 11/26/18   Armandina Stammer I, NP  mirtazapine (REMERON) 7.5 MG tablet Take 1 tablet (7.5 mg total) by mouth at bedtime. For sleep 11/26/18   Armandina Stammer I, NP  potassium chloride (K-DUR,KLOR-CON) 10 MEQ tablet Take 1 tablet (10 mEq total) by mouth daily. For Potassium replacement 11/27/18   Sanjuana Kava, NP    Family History History reviewed. No pertinent family history.  Social History Social History   Tobacco Use   Smoking status: Every Day    Packs/day: 0.25    Types: Cigarettes   Smokeless tobacco: Never  Vaping Use   Vaping Use: Never used  Substance Use Topics   Alcohol use: No   Drug use: No     Allergies   Codeine, Shellfish allergy, and Prednisone   Review of Systems Review of Systems  Respiratory: Negative.    Gastrointestinal: Negative.   Musculoskeletal:  Positive for arthralgias, neck pain and neck stiffness. Negative for gait problem.  Skin: Negative.   Neurological: Negative.     Physical Exam Triage Vital Signs  ED Triage Vitals  Enc Vitals Group     BP 10/03/21 1440 (!) 159/108     Pulse Rate 10/03/21 1440 66     Resp 10/03/21 1440 18     Temp 10/03/21 1440 98.9 F (37.2 C)     Temp Source 10/03/21 1440 Temporal     SpO2 10/03/21 1440 100 %     Weight --      Height --      Head Circumference --      Peak Flow --      Pain Score 10/03/21 1438 10     Pain Loc --      Pain Edu? --      Excl. in GC? --    No data found.  Updated Vital Signs BP (!) 159/108 (BP Location: Right Arm)   Pulse 66   Temp 98.9 F (37.2 C) (Temporal)   Resp 18   LMP 10/29/2018 (Within Weeks)   SpO2 100%   Visual Acuity Right Eye Distance:   Left Eye Distance:   Bilateral  Distance:    Right Eye Near:   Left Eye Near:    Bilateral Near:     Physical Exam Vitals and nursing note reviewed.  Constitutional:      General: She is not in acute distress.    Appearance: She is not ill-appearing.  Cardiovascular:     Rate and Rhythm: Normal rate and regular rhythm.  Pulmonary:     Effort: Pulmonary effort is normal.     Breath sounds: Normal breath sounds.  Abdominal:     General: Bowel sounds are normal.     Palpations: Abdomen is soft.  Musculoskeletal:        General: Tenderness present. No deformity. Normal range of motion.     Comments: Tenderness on palpation over the left side of the neck.  Power is 5/5 in the upper extremities.  Deep tendon reflexes are 2+ in both upper extremities.  Trapezius muscle is tender to palpation as well as the pectoralis muscles.  Neurological:     Mental Status: She is alert.     UC Treatments / Results  Labs (all labs ordered are listed, but only abnormal results are displayed) Labs Reviewed - No data to display  EKG   Radiology No results found.  Procedures Procedures (including critical care time)  Medications Ordered in UC Medications  ketorolac (TORADOL) injection 60 mg (60 mg Intramuscular Given 10/03/21 1630)    Initial Impression / Assessment and Plan / UC Course  I have reviewed the triage vital signs and the nursing notes.  Pertinent labs & imaging results that were available during my care of the patient were reviewed by me and considered in my medical decision making (see chart for details).     1.  Cervical radicular pain: 2.  Muscle strain of the neck: Robaxin 500 mg nightly as needed for muscle spasm and stiffness Toradol 60 mg IM x1 dose Mobic 7.5 mg orally daily Norco as needed for pain Gentle range of motion exercises If pain is persistent or recurrent you may benefit from neurosurgery evaluation.  Return precautions given. Final Clinical Impressions(s) / UC Diagnoses   Final  diagnoses:  Cervical radiculopathy     Discharge Instructions      Gentle range of motion exercises Take medications as prescribed Heating pad use only 20 minutes on-20 minutes off cycle sessions a day.   ED Prescriptions     Medication Sig Dispense Auth.  Provider   methocarbamol (ROBAXIN) 500 MG tablet Take 1 tablet (500 mg total) by mouth at bedtime as needed for muscle spasms. 20 tablet Keondrick Dilks, Britta Mccreedy, MD   meloxicam (MOBIC) 7.5 MG tablet Take 1 tablet (7.5 mg total) by mouth daily. 30 tablet Domonique Cothran, Britta Mccreedy, MD   HYDROcodone-acetaminophen (NORCO/VICODIN) 5-325 MG tablet Take 1 tablet by mouth every 6 (six) hours as needed. 10 tablet Firman Petrow, Britta Mccreedy, MD      I have reviewed the PDMP during this encounter.   Merrilee Jansky, MD 10/03/21 1658    Merrilee Jansky, MD 10/03/21 [phone]

## 2021-10-03 NOTE — ED Triage Notes (Signed)
Pt present left side neck, back, arm and stomach pain. Symptom started two days ago. Pt state that she woke up with this pain. Pt state her left arm feels numb.

## 2021-10-04 ENCOUNTER — Emergency Department (HOSPITAL_COMMUNITY)
Admission: EM | Admit: 2021-10-04 | Discharge: 2021-10-05 | Disposition: A | Discharge status: Home / Self Care | Payer: 59 | Attending: Emergency Medicine | Admitting: Emergency Medicine

## 2021-10-04 ENCOUNTER — Other Ambulatory Visit: Payer: Self-pay

## 2021-10-04 ENCOUNTER — Emergency Department (HOSPITAL_COMMUNITY): Payer: 59

## 2021-10-04 ENCOUNTER — Encounter (HOSPITAL_COMMUNITY): Payer: Self-pay | Admitting: *Deleted

## 2021-10-04 DIAGNOSIS — R000 Tachycardia, unspecified: Secondary | ICD-10-CM | POA: Insufficient documentation

## 2021-10-04 DIAGNOSIS — M79602 Pain in left arm: Secondary | ICD-10-CM | POA: Insufficient documentation

## 2021-10-04 DIAGNOSIS — M25512 Pain in left shoulder: Secondary | ICD-10-CM | POA: Diagnosis present

## 2021-10-04 DIAGNOSIS — R531 Weakness: Secondary | ICD-10-CM | POA: Diagnosis not present

## 2021-10-04 DIAGNOSIS — M4802 Spinal stenosis, cervical region: Secondary | ICD-10-CM

## 2021-10-04 DIAGNOSIS — F17210 Nicotine dependence, cigarettes, uncomplicated: Secondary | ICD-10-CM | POA: Insufficient documentation

## 2021-10-04 DIAGNOSIS — M542 Cervicalgia: Secondary | ICD-10-CM | POA: Insufficient documentation

## 2021-10-04 LAB — CBC WITH DIFFERENTIAL/PLATELET
Abs Immature Granulocytes: 0.02 10*3/uL (ref 0.00–0.07)
Basophils Absolute: 0 10*3/uL (ref 0.0–0.1)
Basophils Relative: 0 %
Eosinophils Absolute: 0.1 10*3/uL (ref 0.0–0.5)
Eosinophils Relative: 1 %
HCT: 38.4 % (ref 36.0–46.0)
Hemoglobin: 12.4 g/dL (ref 12.0–15.0)
Immature Granulocytes: 0 %
Lymphocytes Relative: 38 %
Lymphs Abs: 2.9 10*3/uL (ref 0.7–4.0)
MCH: 29.1 pg (ref 26.0–34.0)
MCHC: 32.3 g/dL (ref 30.0–36.0)
MCV: 90.1 fL (ref 80.0–100.0)
Monocytes Absolute: 0.6 10*3/uL (ref 0.1–1.0)
Monocytes Relative: 7 %
Neutro Abs: 4.1 10*3/uL (ref 1.7–7.7)
Neutrophils Relative %: 54 %
Platelets: 328 10*3/uL (ref 150–400)
RBC: 4.26 MIL/uL (ref 3.87–5.11)
RDW: 15.9 % — ABNORMAL HIGH (ref 11.5–15.5)
WBC: 7.7 10*3/uL (ref 4.0–10.5)
nRBC: 0 % (ref 0.0–0.2)

## 2021-10-04 LAB — BASIC METABOLIC PANEL
Anion gap: 9 (ref 5–15)
BUN: 11 mg/dL (ref 6–20)
CO2: 22 mmol/L (ref 22–32)
Calcium: 9.5 mg/dL (ref 8.9–10.3)
Chloride: 106 mmol/L (ref 98–111)
Creatinine, Ser: 0.7 mg/dL (ref 0.44–1.00)
GFR, Estimated: >60 mL/min (ref >60)
Glucose, Bld: 96 mg/dL (ref 70–99)
Potassium: 3.6 mmol/L (ref 3.5–5.1)
Sodium: 137 mmol/L (ref 135–145)

## 2021-10-04 LAB — I-STAT BETA HCG BLOOD, ED (MC, WL, AP ONLY): I-stat hCG, quantitative: 7.1 m[IU]/mL — ABNORMAL HIGH (ref <5)

## 2021-10-04 MED ORDER — HYDROMORPHONE HCL 1 MG/ML IJ SOLN
0.5000 mg | Freq: Once | INTRAMUSCULAR | Status: AC
  Administered 2021-10-04: 0.5 mg via INTRAVENOUS
  Filled 2021-10-04: qty 1

## 2021-10-04 MED ORDER — ONDANSETRON HCL 4 MG/2ML IJ SOLN
4.0000 mg | Freq: Once | INTRAMUSCULAR | Status: AC
  Administered 2021-10-04: 4 mg via INTRAVENOUS
  Filled 2021-10-04: qty 2

## 2021-10-04 MED ORDER — METHYLPREDNISOLONE 4 MG PO TBPK: ORAL_TABLET | ORAL | 0 refills | Status: DC

## 2021-10-04 MED ORDER — HYDROMORPHONE HCL 1 MG/ML IJ SOLN: 1.0000 mg | Freq: Once | INTRAMUSCULAR | Status: DC

## 2021-10-04 MED ORDER — HYDROCODONE-ACETAMINOPHEN 5-325 MG PO TABS
1.0000 | ORAL_TABLET | Freq: Once | ORAL | Status: AC
  Administered 2021-10-04: 1 via ORAL
  Filled 2021-10-04: qty 1

## 2021-10-04 MED ORDER — LORAZEPAM 2 MG/ML IJ SOLN
1.0000 mg | Freq: Once | INTRAMUSCULAR | Status: AC
  Administered 2021-10-04: 1 mg via INTRAVENOUS
  Filled 2021-10-04: qty 1

## 2021-10-04 NOTE — ED Triage Notes (Signed)
PT c/o continued shoulder pain after being tx at [facility] yesterday.

## 2021-10-04 NOTE — ED Notes (Signed)
Patient transported to MRI 

## 2021-10-04 NOTE — Discharge Instructions (Signed)
If you develop worsening, recurrent, or continued neck pain, numbness or weakness in the arms or legs, incontinence of your bowels or bladders, numbness of your buttocks, fever, or any other new/concerning symptoms then return to the ER for evaluation.

## 2021-10-04 NOTE — ED Provider Notes (Addendum)
[facility] EMERGENCY DEPARTMENT Provider Note   CSN: [id number] Arrival date & time: 10/04/21  1848     History Chief Complaint  Patient presents with   Shoulder Pain    Stacy Cooper is a 50 y.o. female who presents with her husband at the bedside. Who presents with concern for neck pain that radiates to the left arm with associated tingling in the arm and possible weakness in the left hand. Patient unable to clarify If weakness is secondary to pain or true weakness. She was seen in the [facility] UC yesterday with normal reflexes, diagnosed with cervical radiculopathy and muscle spasm. Treated with toradol, robaxin, mobic, and PRN norco.   Presents today as medications "did nothing" to relieve her pain. Moving the limb or her neck worsens the pain. NO hx of trauma, IV drug use, immunocompromising condition, or history of epidural infection. She is not anticoagulated.   I have personally reviewed this patient's medical record. 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 Follows with orthopedics for lumbar radiculopathy.   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     No family history on file.  Social History   Tobacco Use   Smoking status: Every Day    Packs/day: 0.25    Types: Cigarettes   Smokeless tobacco: Never  Vaping Use   Vaping Use: Never used  Substance Use Topics   Alcohol use: No   Drug use: No    Home Medications Prior to Admission medications   Medication Sig Start Date End Date Taking? Authorizing Provider   albuterol (PROVENTIL HFA;VENTOLIN HFA) 108 (90 Base) MCG/ACT inhaler Inhale 2 puffs into the lungs every 6 (six) hours as needed for wheezing or shortness of breath. 11/26/18   Armandina Stammer I, NP  hydrochlorothiazide (MICROZIDE) 12.5 MG capsule Take 1 capsule (12.5 mg total) by mouth daily. For high blood pressure 11/27/18   Nwoko, Nicole Kindred I, NP  HYDROcodone-acetaminophen (NORCO/VICODIN) 5-325 MG tablet Take 1 tablet by mouth every 6 (six) hours as needed. 10/03/21   Merrilee Jansky, MD  hydrOXYzine (ATARAX/VISTARIL) 25 MG tablet Take 1 tablet (25 mg total) by mouth 3 (three) times daily as needed for anxiety. 11/26/18   Armandina Stammer I, NP  meloxicam (MOBIC) 7.5 MG tablet Take 1 tablet (7.5 mg total) by mouth daily. 10/03/21   Lamptey, Britta Mccreedy, MD  methocarbamol (ROBAXIN) 500 MG tablet Take 1 tablet (500 mg total) by mouth at bedtime as needed for muscle spasms. 10/03/21   Merrilee Jansky, MD  mirtazapine (REMERON) 7.5 MG tablet Take 1 tablet (7.5 mg total) by mouth at bedtime. For sleep 11/26/18   Armandina Stammer I, NP  potassium chloride (K-DUR,KLOR-CON) 10 MEQ tablet Take 1 tablet (10 mEq total) by mouth daily. For Potassium replacement 11/27/18   Armandina Stammer I, NP    Allergies    Codeine, Shellfish allergy, and Prednisone  Review of Systems   Review of Systems  Constitutional: Negative.   HENT: Negative.  Eyes: Negative.   Respiratory: Negative.    Cardiovascular: Negative.   Gastrointestinal: Negative.   Genitourinary: Negative.   Musculoskeletal:  Positive for myalgias and neck pain.  Skin: Negative.   Neurological:  Positive for numbness. Negative for tremors, syncope and weakness.   Physical Exam Updated Vital Signs BP (!) 181/100 (BP Location: Right Arm)   Pulse 71   Temp 98.5 F (36.9 C) (Oral)   Resp 15   Ht 5\' 6"  (1.676 m)   Wt 92.5 kg   LMP 10/29/2018 (Within Weeks)   SpO2 100%   BMI 32.93 kg/m   Physical Exam Vitals and nursing note reviewed.  Constitutional:       General: She is in acute distress (Pain).     Appearance: She is not toxic-appearing.     Comments: Who presents with her brother who presents with her husband at the bedside with concern for neck pain with left-sided arm pain, tingling, and reported weakness.  Patient states that she woke up 3 days ago with the symptoms gradually gotten worse.  Yesterday she was seen at she has concern for neck pain that radiates to the left arm with associated tingling and reported weakness.  Patient is tearful secondary to pain.  Who presents with concern for neck pain who presents with concern for neck pain that radiates to the left arm  HENT:     Head: Normocephalic and atraumatic.     Nose: Nose normal. No congestion.     Mouth/Throat:     Mouth: Mucous membranes are moist.     Pharynx: Oropharynx is clear. Uvula midline. No pharyngeal swelling, oropharyngeal exudate or posterior oropharyngeal erythema.  Eyes:     General: Lids are normal. Vision grossly intact.        Right eye: No discharge.        Left eye: No discharge.     Extraocular Movements: Extraocular movements intact.     Conjunctiva/sclera: Conjunctivae normal.     Pupils: Pupils are equal, round, and reactive to light.  Neck:     Trachea: Trachea and phonation normal.   Cardiovascular:     Rate and Rhythm: Regular rhythm. Tachycardia present.     Pulses: Normal pulses.          Radial pulses are 2+ on the right side and 2+ on the left side.     Heart sounds: Normal heart sounds. No murmur heard.    Comments: Crying at time of exam Pulmonary:     Effort: Pulmonary effort is normal. No tachypnea, bradypnea, accessory muscle usage, prolonged expiration or respiratory distress.     Breath sounds: Normal breath sounds. No wheezing or rales.  Chest:     Chest wall: No mass, lacerations, deformity, swelling, tenderness, crepitus or edema.  Abdominal:     General: Bowel sounds are normal. There is no distension.     Palpations:  Abdomen is soft.     Tenderness: There is no abdominal tenderness. There is no guarding or rebound.  Musculoskeletal:        General: No deformity.     Right shoulder: Normal.     Left shoulder: Tenderness present. No swelling, deformity, bony tenderness or crepitus. Normal pulse.     Right upper arm: Normal.     Left upper arm: Tenderness present. No swelling, edema, deformity or bony tenderness.     Right elbow: Normal.     Left elbow: No swelling, deformity, effusion or lacerations. Normal range of motion.  Tenderness present.     Right forearm: Normal.     Left forearm: Tenderness present. No swelling, edema, deformity or bony tenderness.     Right wrist: Normal.     Left wrist: Tenderness present. No bony tenderness, snuff box tenderness or crepitus. Normal range of motion. Normal pulse.     Right hand: Normal.     Left hand: No swelling, deformity or lacerations. Normal range of motion. Decreased strength. There is no disruption of two-point discrimination. Normal capillary refill. Normal pulse.     Cervical back: Normal range of motion and neck supple. Spasms, tenderness and bony tenderness present. No swelling, edema, deformity, erythema, signs of trauma, rigidity or crepitus. Spinous process tenderness and muscular tenderness present. No pain with movement. Normal range of motion.     Thoracic back: Normal. No bony tenderness.     Lumbar back: Normal. No bony tenderness.     Right lower leg: No edema.     Left lower leg: No edema.     Comments: Decreased ROM of the shoulder, elbow, and wrist secondary to pain. Grip strength 5/5 on the right, 3/5 on the left question contribution of pain.   Lymphadenopathy:     Cervical: No cervical adenopathy.  Skin:    General: Skin is warm and dry.     Capillary Refill: Capillary refill takes less than 2 seconds.  Neurological:     Mental Status: She is alert and oriented to person, place, and time. Mental status is at baseline.     Sensory:  Sensation is intact. No sensory deficit.     Motor: Weakness present. No abnormal muscle tone.     Deep Tendon Reflexes:     Reflex Scores:      Bicep reflexes are 2+ on the right side and 2+ on the left side.      Brachioradialis reflexes are 2+ on the right side and 2+ on the left side.    Comments: Unable to assess patient's coordination due acute distress secondary to pain.  Psychiatric:        Mood and Affect: Mood normal.    ED Results / Procedures / Treatments   Labs (all labs ordered are listed, but only abnormal results are displayed) Labs Reviewed  CBC WITH DIFFERENTIAL/PLATELET - Abnormal; Notable for the following components:      Result Value   RDW 15.9 (*)    All other components within normal limits  I-STAT BETA HCG BLOOD, ED ([facility], [facility], [facility] ONLY) - Abnormal; Notable for the following components:   I-stat hCG, quantitative 7.1 (*)    All other components within normal limits  BASIC METABOLIC PANEL    EKG None  Radiology CT Cervical Spine Wo Contrast  Result Date: 10/04/2021 CLINICAL DATA:  Cervical radiculopathy, infection suspected. EXAM: CT CERVICAL SPINE WITHOUT CONTRAST TECHNIQUE: Multidetector CT imaging of the cervical spine was performed without intravenous contrast. Multiplanar CT image reconstructions were also generated. COMPARISON:  10/23/2017. FINDINGS: Alignment: Alignment is within normal limits. There is kyphosis of the mid cervical spine Skull base and vertebrae: No acute fracture. No primary bone lesion or focal pathologic process. Soft tissues and spinal canal: No prevertebral fluid or swelling. No visible canal hematoma. Evaluation for epidural abscess or infection is limited due to lack of IV contrast. Disc levels: There is multilevel intervertebral disc space narrowing and disc osteophyte complexes with calcification along the posterior longitudinal ligament resulting in moderate spinal canal stenosis from C3-C4, C4-C5, C5-C6, and C6-C7. Upper  chest:  Scattered subpleural blebs are noted at the lung apices. Other: None. IMPRESSION: 1. No acute fracture or other acute osseous abnormality. Evaluation for infection is limited due to lack of IV contrast. If clinical concern warrants further evaluation, MRI with contrast is recommended. 2. Multilevel degenerative changes. Electronically Signed   By: Thornell Sartorius M.D.   On: 10/04/2021 20:36    Procedures Procedures   Medications Ordered in ED Medications  HYDROmorphone (DILAUDID) injection 0.5 mg (0.5 mg Intravenous Given 10/04/21 2028)  ondansetron (ZOFRAN) injection 4 mg (4 mg Intravenous Given 10/04/21 2027)  LORazepam (ATIVAN) injection 1 mg (1 mg Intravenous Given 10/04/21 2152)  HYDROmorphone (DILAUDID) injection 0.5 mg (0.5 mg Intravenous Given 10/04/21 2121)    ED Course  I have reviewed the triage vital signs and the nursing notes.  Pertinent labs & imaging results that were available during my care of the patient were reviewed by me and considered in my medical decision making (see chart for details).    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   Differential diagnosis includes but is not limited to spinal cord compression secondary to stenosis, epidural hematoma or infection, acute fracutre or dislocation, vertebral osteomyelitis, cervical radiculopathy.   Hypertensive on intake, cardioplumonary exam is normal, abdominal exam is benign. Cervical spine with midline TTP, left sided paraspinous and trapezius muscular TTP. 2+ radial pulses bilaterally, normal sensation and cap refill  in the left hand, decreased grip strength on the left.   No concern for infection at this time, given lack of systemic symptoms or risk factors. Basic labs pending. CT c-spine negative for acute osseous abnormality. Reason for ordering the image was documented at  "cervical radiculopathy, concern for infection" in error - extremely low clinical concern for infectious etiology in this patient. CT ordered with concern for cervical radiculopathy with worsening symptoms.   CBC unremarkable, BMP unremarkable.   Will proceed with MRI C-spine without contrast at this time. Anxiolysis ordered. Care of this patient signed out to oncoming ED provider Dr. Criss Alvine at time of shift change. All pertinent HPI, physical exam, and laboratory findings were discussed with him prior to my departure. Patient pending MRI at time of shift change.   Catheryne voiced understanding of her medical evaluation and treatment plan. Each of her questions was answered to her expressed satisfaction.    This chart was dictated using voice recognition software, Dragon. Despite the best efforts of this provider to proofread and correct errors, errors may still occur which can change documentation meaning.  Final Clinical Impression(s) / ED Diagnoses Final diagnoses:  None    Rx / DC Orders ED Discharge Orders     None        Paris Lore, PA-C 10/04/21 2152    Leyana Whidden, Eugene Gavia, PA-C 10/04/21 2153    Pricilla Loveless, MD 10/04/21 [phone]

## 2021-10-04 NOTE — ED Notes (Signed)
MD notified of pts BP 188/103

## 2021-10-05 NOTE — ED Notes (Signed)
RN reviewed discharge instructions with pt. Pt verbalized understanding and had no further questions. VSS upon discharge.  

## 2021-10-22 ENCOUNTER — Encounter: Payer: Self-pay | Admitting: Internal Medicine

## 2022-03-01 ENCOUNTER — Other Ambulatory Visit: Payer: Self-pay | Admitting: Physician Assistant

## 2022-03-01 DIAGNOSIS — Z1231 Encounter for screening mammogram for malignant neoplasm of breast: Secondary | ICD-10-CM

## 2022-03-11 ENCOUNTER — Ambulatory Visit
Admission: RE | Admit: 2022-03-11 | Discharge: 2022-03-11 | Disposition: A | Discharge status: Home / Self Care | Payer: Managed Care, Other (non HMO) | Source: Ambulatory Visit | Attending: Physician Assistant | Admitting: Physician Assistant

## 2022-03-11 DIAGNOSIS — Z1231 Encounter for screening mammogram for malignant neoplasm of breast: Secondary | ICD-10-CM

## 2022-11-07 ENCOUNTER — Other Ambulatory Visit: Payer: Self-pay

## 2022-11-07 ENCOUNTER — Emergency Department (HOSPITAL_COMMUNITY): Payer: Managed Care, Other (non HMO)

## 2022-11-07 ENCOUNTER — Emergency Department (HOSPITAL_COMMUNITY)
Admission: EM | Admit: 2022-11-07 | Discharge: 2022-11-07 | Disposition: A | Discharge status: Home / Self Care | Payer: Managed Care, Other (non HMO) | Attending: Emergency Medicine | Admitting: Emergency Medicine

## 2022-11-07 DIAGNOSIS — I10 Essential (primary) hypertension: Secondary | ICD-10-CM | POA: Diagnosis not present

## 2022-11-07 DIAGNOSIS — Z79899 Other long term (current) drug therapy: Secondary | ICD-10-CM | POA: Diagnosis not present

## 2022-11-07 DIAGNOSIS — R0789 Other chest pain: Secondary | ICD-10-CM | POA: Diagnosis present

## 2022-11-07 LAB — BASIC METABOLIC PANEL
Anion gap: 9 (ref 5–15)
BUN: 15 mg/dL (ref 6–20)
CO2: 23 mmol/L (ref 22–32)
Calcium: 9.2 mg/dL (ref 8.9–10.3)
Chloride: 107 mmol/L (ref 98–111)
Creatinine, Ser: 0.69 mg/dL (ref 0.44–1.00)
GFR, Estimated: >60 mL/min (ref >60)
Glucose, Bld: 93 mg/dL (ref 70–99)
Potassium: 3.8 mmol/L (ref 3.5–5.1)
Sodium: 139 mmol/L (ref 135–145)

## 2022-11-07 LAB — CBC
HCT: 36.9 % (ref 36.0–46.0)
Hemoglobin: 12.5 g/dL (ref 12.0–15.0)
MCH: 30.9 pg (ref 26.0–34.0)
MCHC: 33.9 g/dL (ref 30.0–36.0)
MCV: 91.1 fL (ref 80.0–100.0)
Platelets: 348 10*3/uL (ref 150–400)
RBC: 4.05 MIL/uL (ref 3.87–5.11)
RDW: 15.4 % (ref 11.5–15.5)
WBC: 7.6 10*3/uL (ref 4.0–10.5)
nRBC: 0 % (ref 0.0–0.2)

## 2022-11-07 LAB — TROPONIN I (HIGH SENSITIVITY)
Troponin I (High Sensitivity): 3 ng/L (ref <18)
Troponin I (High Sensitivity): 3 ng/L (ref <18)

## 2022-11-07 MED ORDER — KETOROLAC TROMETHAMINE 15 MG/ML IJ SOLN
15.0000 mg | Freq: Once | INTRAMUSCULAR | Status: AC
  Administered 2022-11-07: 15 mg via INTRAVENOUS
  Filled 2022-11-07: qty 1

## 2022-11-07 MED ORDER — TIZANIDINE HCL 4 MG PO TABS: 4.0000 mg | ORAL_TABLET | Freq: Four times a day (QID) | ORAL | 0 refills | Status: DC | PRN

## 2022-11-07 MED ORDER — MORPHINE SULFATE (PF) 4 MG/ML IV SOLN
4.0000 mg | Freq: Once | INTRAVENOUS | Status: AC
  Administered 2022-11-07: 4 mg via INTRAVENOUS
  Filled 2022-11-07: qty 1

## 2022-11-07 MED ORDER — ONDANSETRON HCL 4 MG/2ML IJ SOLN
4.0000 mg | Freq: Once | INTRAMUSCULAR | Status: AC | PRN
  Administered 2022-11-07: 4 mg via INTRAVENOUS
  Filled 2022-11-07: qty 2

## 2022-11-07 NOTE — ED Provider Notes (Signed)
[facility] EMERGENCY DEPARTMENT Provider Note   CSN: [id number] Arrival date & time: 11/07/22  [id number]     History  Chief Complaint  Patient presents with   Chest Pain    Stacy Cooper is a 51 y.o. female with past medical history significant for hypertension, anemia who presents with concern for chest pain that started 2 days ago.  Patient reports that she has significant musculoskeletal pathology with bone spurs and cervical spine, compressed nerve in low back, and she began taking a new muscle relaxant which she thinks caused her symptoms.  Patient reports that she has never had any problems with tizanidine I would like to start this medication again.  She reports no exertional component of her chest pain.  She reports that it goes across her entire chest, if anything is more localized in her right arm, right shoulder at this time, denies pain radiating to left arm, neck, nausea, vomiting, she has not previously seen a cardiologist before.  Denies previous ACS, stroke, diabetes, she does smoke cigarettes, reporting around quarter pack per day.  Chest Pain      Home Medications Prior to Admission medications   Medication Sig Start Date End Date Taking? Authorizing Provider  tiZANidine (ZANAFLEX) 4 MG tablet Take 1 tablet (4 mg total) by mouth every 6 (six) hours as needed for muscle spasms. 11/07/22  Yes Tawonna Esquer H, PA-C  albuterol (PROVENTIL HFA;VENTOLIN HFA) 108 (90 Base) MCG/ACT inhaler Inhale 2 puffs into the lungs every 6 (six) hours as needed for wheezing or shortness of breath. 11/26/18   Lindell Spar I, NP  hydrochlorothiazide (MICROZIDE) 12.5 MG capsule Take 1 capsule (12.5 mg total) by mouth daily. For high blood pressure 11/27/18   Nwoko, Herbert Pun I, NP  HYDROcodone-acetaminophen (NORCO/VICODIN) 5-325 MG tablet Take 1 tablet by mouth every 6 (six) hours as needed. 10/03/21   Chase Picket, MD  hydrOXYzine (ATARAX/VISTARIL) 25 MG tablet  Take 1 tablet (25 mg total) by mouth 3 (three) times daily as needed for anxiety. 11/26/18   Lindell Spar I, NP  meloxicam (MOBIC) 7.5 MG tablet Take 1 tablet (7.5 mg total) by mouth daily. 10/03/21   Chase Picket, MD  methylPREDNISolone (MEDROL DOSEPAK) 4 MG TBPK tablet Take per pack instructions 10/04/21   Sherwood Gambler, MD  mirtazapine (REMERON) 7.5 MG tablet Take 1 tablet (7.5 mg total) by mouth at bedtime. For sleep 11/26/18   Lindell Spar I, NP  potassium chloride (K-DUR,KLOR-CON) 10 MEQ tablet Take 1 tablet (10 mEq total) by mouth daily. For Potassium replacement 11/27/18   Lindell Spar I, NP      Allergies    Codeine, Shellfish allergy, and Prednisone    Review of Systems   Review of Systems  Cardiovascular:  Positive for chest pain.  All other systems reviewed and are negative.   Physical Exam Updated Vital Signs BP 108/68   Pulse 75   Temp 98.2 F (36.8 C)   Resp 16   Ht 5\' 6"  (1.676 m)   Wt 90.7 kg   SpO2 100%   BMI 32.28 kg/m  Physical Exam Vitals and nursing note reviewed.  Constitutional:      General: She is not in acute distress.    Appearance: Normal appearance.  HENT:     Head: Normocephalic and atraumatic.  Eyes:     General:        Right eye: No discharge.        Left eye: No discharge.  Cardiovascular:     Rate and Rhythm: Normal rate and regular rhythm.     Heart sounds: No murmur heard.    No friction rub. No gallop.     Comments: Patient with 1 documented pulse in the bradycardic range, on my exam she is with normal heart rate and rhythm. Pulmonary:     Effort: Pulmonary effort is normal.     Breath sounds: Normal breath sounds.  Chest:     Comments: Patient with significant tenderness to palpation of the chest wall, extending into right shoulder, and back as well as neck.  Less significant tenderness to palpation of left-sided chest wall. Abdominal:     General: Bowel sounds are normal.     Palpations: Abdomen is soft.  Skin:     General: Skin is warm and dry.     Capillary Refill: Capillary refill takes less than 2 seconds.  Neurological:     Mental Status: She is alert and oriented to person, place, and time.  Psychiatric:        Mood and Affect: Mood normal.        Behavior: Behavior normal.     ED Results / Procedures / Treatments   Labs (all labs ordered are listed, but only abnormal results are displayed) Labs Reviewed  BASIC METABOLIC PANEL  CBC  TROPONIN I (HIGH SENSITIVITY)  TROPONIN I (HIGH SENSITIVITY)    EKG None  Radiology DG Chest 2 View  Result Date: 11/07/2022 CLINICAL DATA:  Chest pain and shortness of breath. EXAM: CHEST - 2 VIEW COMPARISON:  Two-view chest x-ray 10/18/2015 FINDINGS: The heart size is normal. Linear opacities are present in the left mid and lower lobe. No significant airspace consolidation is present. Minimal atelectasis is present at the right base. No significant effusions are present. Visualized soft tissues and bony thorax are unremarkable. IMPRESSION: Linear opacities in the left mid and lower lobe likely reflect atelectasis. Infection is not excluded. Electronically Signed   By: San Morelle M.D.   On: 11/07/2022 08:09    Procedures Procedures    Medications Ordered in ED Medications  morphine (PF) 4 MG/ML injection 4 mg (4 mg Intravenous Given 11/07/22 1350)  ketorolac (TORADOL) 15 MG/ML injection 15 mg (15 mg Intravenous Given 11/07/22 1351)  ondansetron (ZOFRAN) injection 4 mg (4 mg Intravenous Given 11/07/22 1351)    ED Course/ Medical Decision Making/ A&P                           Medical Decision Making Amount and/or Complexity of Data Reviewed Labs: ordered. Radiology: ordered.   This patient is a 51 y.o. female  who presents to the ED for concern of chest pain which she thinks may be related to her muscle relaxant use, ongoing for 2 days.   Differential diagnoses prior to evaluation: The emergent differential diagnosis includes, but  is not limited to,  ACS, AAS, PE, Mallory-Weiss, Boerhaave's, Pneumonia, acute bronchitis, asthma or COPD exacerbation, anxiety, MSK pain or traumatic injury to the chest, acid reflux versus other Considered that this could be secondary to her muscle relaxant use with an atypical side effect, however I think this is less likely than musculoskeletal versus other.   This is not an exhaustive differential.   Past Medical History / Co-morbidities: Hypertension, multiple musculoskeletal complaints, she reports bone spurs in cervical spine, pinched nerve in lumbar spine, chronic back pain  Additional history: Chart reviewed. Pertinent results include: Reviewed outpatient general  surgery, and pain medicine clinic in regards to cervical stenosis, lumbar radiculopathy, remote lab work, imaging from previous emergency department visits  Physical Exam: Physical exam performed. The pertinent findings include: On exam patient with tenderness palpation of anterior chest wall, more focally on the right than the left.  She moves all 4 limbs spontaneously, she does have some tenderness of the cervical and lumbar paraspinous muscles  Lab Tests/Imaging studies: I personally interpreted labs/imaging and the pertinent results include: CBC unremarkable, BMP unremarkable, troponin negative x 2 in context of n nonexertional, generalized chest pain not on the left side, more focally on the right side, chest pain ongoing for 2 days.  Independently interpreted plain film chest x-ray which shows no intrathoracic abnormality I agree with the radiologist interpretation.  Cardiac monitoring: EKG obtained and interpreted by my attending physician which shows: Normal sinus rhythm   Medications: I ordered medication including Toradol, Zofran, morphine for pain.  Patient endorses some improvement after pain medication..  I have reviewed the patients home medicines and have made adjustments as needed.   Disposition: After  consideration of the diagnostic results and the patients response to treatment, I feel that patient with noncardiac story for her chest pain, nonexertional nature, seems related to chronic musculoskeletal complaints, I have low suspicion overall of this being a medication reaction to methocarbamol, however discussed that patient can discontinue the medication at this time and I will refill her tizanidine which she has been using in the past with positive effects, encourage follow-up with orthopedics, and as patient is never seen cardiology before using it be reasonable to schedule outpatient cardiology follow-up.   emergency department workup does not suggest an emergent condition requiring admission or immediate intervention beyond what has been performed at this time. The plan is: as above. The patient is safe for discharge and has been instructed to return immediately for worsening symptoms, change in symptoms or any other concerns.  Final Clinical Impression(s) / ED Diagnoses Final diagnoses:  Chest wall pain  Atypical chest pain    Rx / DC Orders ED Discharge Orders          Ordered    tiZANidine (ZANAFLEX) 4 MG tablet  Every 6 hours PRN        11/07/22 1330    Ambulatory referral to Cardiology       Comments: If you have not heard from the Cardiology office within the next 72 hours please call [phone].   11/07/22 1330              Makari Portman, Harrel Carina, PA-C 11/07/22 1421    Virgina Norfolk, DO 11/07/22 1543

## 2022-11-07 NOTE — Discharge Instructions (Signed)
Please use Tylenol or ibuprofen for pain.  You may use 600 mg ibuprofen every 6 hours or 1000 mg of Tylenol every 6 hours.  You may choose to alternate between the 2.  This would be most effective.  Not to exceed 4 g of Tylenol within 24 hours.  Not to exceed 3200 mg ibuprofen 24 hours.  You can use the muscle relaxant I am prescribing in addition to the above to help with any breakthrough pain.  You can take it up to 4x daily.  It is safe to take at night, but I would be cautious taking it during the day as it can cause some drowsiness.  Make sure that you are feeling awake and alert before you get behind the wheel of a car or operate a motor vehicle.  It is not a narcotic pain medication so you are able to take it if it is not making you drowsy and still pilot a vehicle or machinery safely.

## 2022-11-07 NOTE — ED Triage Notes (Signed)
Pt. Stated, Im having chest pain that started 2 days ago. My Dr. Riley Churches my medicine and that's when it started hurting and just got worse. Denies any other symptom.

## 2022-11-29 ENCOUNTER — Ambulatory Visit: Payer: Managed Care, Other (non HMO) | Attending: Cardiology | Admitting: Cardiology

## 2022-11-29 ENCOUNTER — Encounter: Payer: Self-pay | Admitting: Cardiology

## 2022-11-29 VITALS — BP 152/96 | HR 72 | Ht 65.5 in | Wt 205.0 lb

## 2022-11-29 DIAGNOSIS — F17200 Nicotine dependence, unspecified, uncomplicated: Secondary | ICD-10-CM

## 2022-11-29 DIAGNOSIS — Z131 Encounter for screening for diabetes mellitus: Secondary | ICD-10-CM | POA: Diagnosis not present

## 2022-11-29 DIAGNOSIS — Z13220 Encounter for screening for lipoid disorders: Secondary | ICD-10-CM | POA: Diagnosis not present

## 2022-11-29 DIAGNOSIS — R079 Chest pain, unspecified: Secondary | ICD-10-CM

## 2022-11-29 DIAGNOSIS — Z79899 Other long term (current) drug therapy: Secondary | ICD-10-CM | POA: Diagnosis not present

## 2022-11-29 DIAGNOSIS — I10 Essential (primary) hypertension: Secondary | ICD-10-CM

## 2022-11-29 MED ORDER — AMLODIPINE BESYLATE 2.5 MG PO TABS: 2.5000 mg | ORAL_TABLET | Freq: Every day | ORAL | 3 refills | Status: DC

## 2022-11-29 MED ORDER — METOPROLOL TARTRATE 100 MG PO TABS: ORAL_TABLET | ORAL | 0 refills | Status: DC

## 2022-11-29 NOTE — Patient Instructions (Addendum)
Medication Instructions:  Your physician has recommended you make the following change in your medication:  START: Amlodipine 2.5 mg once daily *If you need a refill on your cardiac medications before your next appointment, please call your pharmacy*   Lab Work: Your physician recommends that you have labs drawn today: HgbA1c, BMET, Mag, Lipids If you have labs (blood work) drawn today and your tests are completely normal, you will receive your results only by: [place] (if you have MyChart) OR A paper copy in the mail If you have any lab test that is abnormal or we need to change your treatment, we will call you to review the results.   Testing/Procedures:   Your cardiac CT will be scheduled at one of the below locations:   [facility] [address] [place], [place] [address] [phone]   If scheduled at [facility], please arrive at the [facility] and Children's Entrance (Entrance C2) of [facility] 30 minutes prior to test start time. You can use the FREE valet parking offered at entrance C (encouraged to control the heart rate for the test)  Proceed to the [facility] Radiology Department (first floor) to check-in and test prep.  All radiology patients and guests should use entrance C2 at [facility], accessed from [facility], even though the hospital's physical address listed is [address]      Please follow these instructions carefully (unless otherwise directed):  On the Night Before the Test: Be sure to Drink plenty of water. Do not consume any caffeinated/decaffeinated beverages or chocolate 12 hours prior to your test. Do not take any antihistamines 12 hours prior to your test.  On the Day of the Test: Drink plenty of water until 1 hour prior to the test. Do not eat any food 1 hour prior to test. You may take your regular medications prior to the test.  Take metoprolol (Lopressor) two hours  prior to test. HOLD Furosemide/Hydrochlorothiazide morning of the test. FEMALES- please wear underwire-free bra if available, avoid dresses & tight clothing  After the Test: Drink plenty of water. After receiving IV contrast, you may experience a mild flushed feeling. This is normal. On occasion, you may experience a mild rash up to 24 hours after the test. This is not dangerous. If this occurs, you can take Benadryl 25 mg and increase your fluid intake. If you experience trouble breathing, this can be serious. If it is severe call 911 IMMEDIATELY. If it is mild, please call our office. If you take any of these medications: Glipizide/Metformin, Avandament, Glucavance, please do not take 48 hours after completing test unless otherwise instructed.  We will call to schedule your test 2-4 weeks out understanding that some insurance companies will need an authorization prior to the service being performed.   For non-scheduling related questions, please contact the cardiac imaging nurse navigator should you have any questions/concerns: Marchia Bond, Cardiac Imaging Nurse Navigator Gordy Clement, Cardiac Imaging Nurse Navigator [facility] Direct Office Dial: [phone]   For scheduling needs, including cancellations and rescheduling, please call [place], [phone].    Follow-Up: At [facility], you and your health needs are our priority.  As part of our continuing mission to provide you with exceptional heart care, we have created designated Provider Care Teams.  These Care Teams include your primary Cardiologist (physician) and Advanced Practice Providers (APPs -  Physician Assistants and Nurse Practitioners) who all work together  to provide you with the care you need, when you need it.  We recommend signing up for the patient portal called "MyChart".  Sign up information is provided on this After Visit Summary.  MyChart is used to connect with  patients for Virtual Visits (Telemedicine).  Patients are able to view lab/test results, encounter notes, upcoming appointments, etc.  Non-urgent messages can be sent to your provider as well.   To learn more about what you can do with MyChart, go to [email or web address].    Your next appointment:   16 week(s)  Provider:   Berniece Salines, DO    Other Instructions  Steps to Quit Smoking Smoking tobacco is the leading cause of preventable death. It can affect almost every organ in the body. Smoking puts you and people around you at risk for many serious, long-lasting (chronic) diseases. Quitting smoking can be hard, but it is one of the best things that you can do for your health. It is never too late to quit. Do not give up if you cannot quit the first time. Some people need to try many times to quit. Do your best to stick to your quit plan, and talk with your doctor if you have any questions or concerns. How do I get ready to quit? Pick a date to quit. Set a date within the next 2 weeks to give you time to prepare. Write down the reasons why you are quitting. Keep this list in places where you will see it often. Tell your family, friends, and co-workers that you are quitting. Their support is important. Talk with your doctor about the choices that may help you quit. Find out if your health insurance will pay for these treatments. Know the people, places, things, and activities that make you want to smoke (triggers). Avoid them. What first steps can I take to quit smoking? Throw away all cigarettes at home, at work, and in your car. Throw away the things that you use when you smoke, such as ashtrays and lighters. Clean your car. Empty the ashtray. Clean your home, including curtains and carpets. What can I do to help me quit smoking? Talk with your doctor about taking medicines and seeing a counselor. You are more likely to succeed when you do both. If you are pregnant or  breastfeeding: Talk with your doctor about counseling or other ways to quit smoking. Do not take medicine to help you quit smoking unless your doctor tells you to. Quit right away Quit smoking completely, instead of slowly cutting back on how much you smoke over a period of time. Stopping smoking right away may be more successful than slowly quitting. Go to counseling. In-person is best if this is an option. You are more likely to quit if you go to counseling sessions regularly. Take medicine You may take medicines to help you quit. Some medicines need a prescription, and some you can buy over-the-counter. Some medicines may contain a drug called nicotine to replace the nicotine in cigarettes. Medicines may: Help you stop having the desire to smoke (cravings). Help to stop the problems that come when you stop smoking (withdrawal symptoms). Your doctor may ask you to use: Nicotine patches, gum, or lozenges. Nicotine inhalers or sprays. Non-nicotine medicine that you take by mouth. Find resources Find resources and other ways to help you quit smoking and remain smoke-free after you quit. They include: Online chats with a Social worker. Phone quitlines. Printed Furniture conservator/restorer. Support groups or group counseling. Text messaging  programs. Mobile phone apps. Use apps on your mobile phone or tablet that can help you stick to your quit plan. Examples of free services include Quit Guide from the CDC and smokefree.gov  What can I do to make it easier to quit?  Talk to your family and friends. Ask them to support and encourage you. Call a phone quitline, such as 1-800-QUIT-NOW, reach out to support groups, or work with a Veterinary surgeon. Ask people who smoke to not smoke around you. Avoid places that make you want to smoke, such as: Bars. Parties. Smoke-break areas at work. Spend time with people who do not smoke. Lower the stress in your life. Stress can make you want to smoke. Try these things to  lower stress: Getting regular exercise. Doing deep-breathing exercises. Doing yoga. Meditating. What benefits will I see if I quit smoking? Over time, you may have: A better sense of smell and taste. Less coughing and sore throat. A slower heart rate. Lower blood pressure. Clearer skin. Better breathing. Fewer sick days. Summary Quitting smoking can be hard, but it is one of the best things that you can do for your health. Do not give up if you cannot quit the first time. Some people need to try many times to quit. When you decide to quit smoking, make a plan to help you succeed. Quit smoking right away, not slowly over a period of time. When you start quitting, get help and support to keep you smoke-free. This information is not intended to replace advice given to you by your health care provider. Make sure you discuss any questions you have with your health care provider. Document Revised: 10/19/2021 Document Reviewed: 10/19/2021 Elsevier Patient Education  2023 [employer].

## 2022-11-29 NOTE — Progress Notes (Signed)
Cardiology Office Note:    Date:  11/30/2022   ID:  Stacy Cooper, DOB 1970/11/30, MRN [id number]  PCP:  Benito Mccreedy, MD  Cardiologist:  Berniece Salines, DO  Electrophysiologist:  None   Referring MD: Anselmo Pickler, *   " I am chest pain"  History of Present Illness:    Walter Min is a 52 y.o. female with a hx of hypertension, smoker and obesity.  She is here today with her husband.  She reports that she has been experiencing any chest tightness.  She described as a midsternal pain starts in diffuse scar around her breast area.  She admits to associated shortness of breath.  No lightheadedness or dizziness.  She was seen in emergency department at [facility] troponin was negative EKG was not impressive.  Past Medical History:  Diagnosis Date   Anemia     Past Surgical History:  Procedure Laterality Date   CESAREAN SECTION     TUBAL LIGATION      Current Medications: Current Meds  Medication Sig   acetaminophen (TYLENOL) 650 MG CR tablet Tylenol Arthritis Extended Release   amLODipine (NORVASC) 2.5 MG tablet Take 1 tablet (2.5 mg total) by mouth daily.   cyclobenzaprine (FLEXERIL) 10 MG tablet Take by mouth.   hydrochlorothiazide (HYDRODIURIL) 25 MG tablet 1 tab(s) orally once a day for 30 day(s)   lidocaine (HM LIDOCAINE PATCH) 4 % Place 1 patch onto the skin daily.   metoprolol tartrate (LOPRESSOR) 100 MG tablet Take 2 hours prior to CT   pregabalin (LYRICA) 75 MG capsule Take by mouth.     Allergies:   Codeine, Shellfish allergy, and Prednisone   Social History   Socioeconomic History   Marital status: Married    Spouse name: Not on file   Number of children: Not on file   Years of education: Not on file   Highest education level: Not on file  Occupational History   Not on file  Tobacco Use   Smoking status: Every Day    Packs/day: 0.25    Types: Cigarettes   Smokeless tobacco: Never  Vaping Use   Vaping Use:  Never used  Substance and Sexual Activity   Alcohol use: No   Drug use: No   Sexual activity: Yes    Birth control/protection: None  Other Topics Concern   Not on file  Social History Narrative   Not on file   Social Determinants of Health   Financial Resource Strain: Low Risk  (11/22/2018)   Overall Financial Resource Strain (CARDIA)    Difficulty of Paying Living Expenses: Not hard at all  Food Insecurity: No Food Insecurity (11/22/2018)   Hunger Vital Sign    Worried About Running Out of Food in the Last Year: Never true    Ran Out of Food in the Last Year: Never true  Transportation Needs: No Transportation Needs (11/22/2018)   PRAPARE - Hydrologist (Medical): No    Lack of Transportation (Non-Medical): No  Physical Activity: Inactive (11/22/2018)   Exercise Vital Sign    Days of Exercise per Week: 0 days    Minutes of Exercise per Session: 0 min  Stress: Stress Concern Present (11/22/2018)   [place]    Feeling of Stress : Very much  Social Connections: Somewhat Isolated (11/22/2018)   Social Connection and Isolation Panel [NHANES]    Frequency of Communication with Friends and Family: Once  a week    Frequency of Social Gatherings with Friends and Family: Once a week    Attends Religious Services: 1 to 4 times per year    Active Member of [employer] or Organizations: No    Attends Music therapist: Not asked    Marital Status: Married     Family History: The patient's family history is not on file.  ROS:   Review of Systems  Constitution: Negative for decreased appetite, fever and weight gain.  HENT: Negative for congestion, ear discharge, hoarse voice and sore throat.   Eyes: Negative for discharge, redness, vision loss in right eye and visual halos.  Cardiovascular: Negative for chest pain, dyspnea on exertion, leg swelling, orthopnea and palpitations.   Respiratory: Negative for cough, hemoptysis, shortness of breath and snoring.   Endocrine: Negative for heat intolerance and polyphagia.  Hematologic/Lymphatic: Negative for bleeding problem. Does not bruise/bleed easily.  Skin: Negative for flushing, nail changes, rash and suspicious lesions.  Musculoskeletal: Negative for arthritis, joint pain, muscle cramps, myalgias, neck pain and stiffness.  Gastrointestinal: Negative for abdominal pain, bowel incontinence, diarrhea and excessive appetite.  Genitourinary: Negative for decreased libido, genital sores and incomplete emptying.  Neurological: Negative for brief paralysis, focal weakness, headaches and loss of balance.  Psychiatric/Behavioral: Negative for altered mental status, depression and suicidal ideas.  Allergic/Immunologic: Negative for HIV exposure and persistent infections.    EKGs/Labs/Other Studies Reviewed:    The following studies were reviewed today:   EKG:  The ekg ordered today demonstrates   Recent Labs: 11/07/2022: Hemoglobin 12.5; Platelets 348 11/29/2022: BUN 18; Creatinine, Ser 0.72; Magnesium 1.9; Potassium 4.6; Sodium 141  Recent Lipid Panel    Component Value Date/Time   CHOL 191 11/29/2022 1140   TRIG 73 11/29/2022 1140   HDL 49 11/29/2022 1140   CHOLHDL 3.9 11/29/2022 1140   CHOLHDL 2.8 11/23/2018 0650   VLDL 17 11/23/2018 0650   LDLCALC 129 (H) 11/29/2022 1140    Physical Exam:    VS:  BP (!) 152/96   Pulse 72   Ht 5' 5.5" (1.664 m)   Wt 93 kg   SpO2 98%   BMI 33.59 kg/m     Wt Readings from Last 3 Encounters:  11/29/22 93 kg  11/07/22 90.7 kg  10/04/21 92.5 kg     GEN: Well nourished, well developed in no acute distress HEENT: Normal NECK: No JVD; No carotid bruits LYMPHATICS: No lymphadenopathy CARDIAC: S1S2 noted,RRR, no murmurs, rubs, gallops RESPIRATORY:  Clear to auscultation without rales, wheezing or rhonchi  ABDOMEN: Soft, non-tender, non-distended, +bowel sounds, no  guarding. EXTREMITIES: No edema, No cyanosis, no clubbing MUSCULOSKELETAL:  No deformity  SKIN: Warm and dry NEUROLOGIC:  Alert and oriented x 3, non-focal PSYCHIATRIC:  Normal affect, good insight  ASSESSMENT:    1. Chest pain, unspecified type   2. Medication management   3. Screening for diabetes mellitus (DM)   4. Screening for hyperlipidemia   5. Primary hypertension   6. Smoker    PLAN:     The symptoms chest pain is concerning, this patient does have intermediate risk for coronary artery disease and at this time I would like to pursue an ischemic evaluation in this patient.  Shared decision a coronary CTA at this time is appropriate.  I have discussed with the patient about the testing.  The patient has no IV contrast allergy and is agreeable to proceed with this test.  Her blood pressure is elevated in the office  today.  She is on HCTZ 12.5 mg I will like to add hydrochlorothiazide 12.5 mg daily to help with this.  Goal is less than 130/80.  She is a smoker and desires to quit.  She has told her husband not to buy any more steroid.  Give the patient information for [place] website for resources that would help with helping her maintain being smoke-free.  The patient is in agreement with the above plan. The patient left the office in stable condition.  The patient will follow up in   Medication Adjustments/Labs and Tests Ordered: Current medicines are reviewed at length with the patient today.  Concerns regarding medicines are outlined above.  Orders Placed This Encounter  Procedures   CT CORONARY MORPH W/CTA COR W/SCORE W/CA W/CM &/OR WO/CM   Basic Metabolic Panel (BMET)   Hemoglobin A1c   Magnesium   Lipid panel   Meds ordered this encounter  Medications   amLODipine (NORVASC) 2.5 MG tablet    Sig: Take 1 tablet (2.5 mg total) by mouth daily.    Dispense:  90 tablet    Refill:  3   metoprolol tartrate (LOPRESSOR) 100 MG tablet    Sig: Take 2 hours prior to CT     Dispense:  1 tablet    Refill:  0    Patient Instructions  Medication Instructions:  Your physician has recommended you make the following change in your medication:  START: Amlodipine 2.5 mg once daily *If you need a refill on your cardiac medications before your next appointment, please call your pharmacy*   Lab Work: Your physician recommends that you have labs drawn today: HgbA1c, BMET, Mag, Lipids If you have labs (blood work) drawn today and your tests are completely normal, you will receive your results only by: MyChart Message (if you have MyChart) OR A paper copy in the mail If you have any lab test that is abnormal or we need to change your treatment, we will call you to review the results.   Testing/Procedures:   Your cardiac CT will be scheduled at one of the below locations:   [facility] [address] [place], [place] [address] [phone]   If scheduled at [facility], please arrive at the [facility] and [facility] Entrance (Entrance C2) of [facility] 30 minutes prior to test start time. You can use the FREE valet parking offered at entrance C (encouraged to control the heart rate for the test)  Proceed to the [facility] Radiology Department (first floor) to check-in and test prep.  All radiology patients and guests should use entrance C2 at [facility], accessed from [facility], even though the hospital's physical address listed is [address].      Please follow these instructions carefully (unless otherwise directed):  On the Night Before the Test: Be sure to Drink plenty of water. Do not consume any caffeinated/decaffeinated beverages or chocolate 12 hours prior to your test. Do not take any antihistamines 12 hours prior to your test.  On the Day of the Test: Drink plenty of water until 1 hour prior to the test. Do not eat any food 1 hour prior to test. You may take your regular  medications prior to the test.  Take metoprolol (Lopressor) two hours prior to test. HOLD Furosemide/Hydrochlorothiazide morning of the test. FEMALES- please wear underwire-free bra if available, avoid dresses & tight clothing  After the Test: Drink plenty of water. After receiving  IV contrast, you may experience a mild flushed feeling. This is normal. On occasion, you may experience a mild rash up to 24 hours after the test. This is not dangerous. If this occurs, you can take Benadryl 25 mg and increase your fluid intake. If you experience trouble breathing, this can be serious. If it is severe call 911 IMMEDIATELY. If it is mild, please call our office. If you take any of these medications: Glipizide/Metformin, Avandament, Glucavance, please do not take 48 hours after completing test unless otherwise instructed.  We will call to schedule your test 2-4 weeks out understanding that some insurance companies will need an authorization prior to the service being performed.   For non-scheduling related questions, please contact the cardiac imaging nurse navigator should you have any questions/concerns: Marchia Bond, Cardiac Imaging Nurse Navigator Gordy Clement, Cardiac Imaging Nurse Navigator [facility] Direct Office Dial: [phone]   For scheduling needs, including cancellations and rescheduling, please call [place], [phone].    Follow-Up: At [facility], you and your health needs are our priority.  As part of our continuing mission to provide you with exceptional heart care, we have created designated Provider Care Teams.  These Care Teams include your primary Cardiologist (physician) and Advanced Practice Providers (APPs -  Physician Assistants and Nurse Practitioners) who all work together to provide you with the care you need, when you need it.  We recommend signing up for the patient portal called "MyChart".  Sign up information is  provided on this After Visit Summary.  MyChart is used to connect with patients for Virtual Visits (Telemedicine).  Patients are able to view lab/test results, encounter notes, upcoming appointments, etc.  Non-urgent messages can be sent to your provider as well.   To learn more about what you can do with MyChart, go to [email or web address].    Your next appointment:   16 week(s)  Provider:   Berniece Salines, DO    Other Instructions  Steps to Quit Smoking Smoking tobacco is the leading cause of preventable death. It can affect almost every organ in the body. Smoking puts you and people around you at risk for many serious, long-lasting (chronic) diseases. Quitting smoking can be hard, but it is one of the best things that you can do for your health. It is never too late to quit. Do not give up if you cannot quit the first time. Some people need to try many times to quit. Do your best to stick to your quit plan, and talk with your doctor if you have any questions or concerns. How do I get ready to quit? Pick a date to quit. Set a date within the next 2 weeks to give you time to prepare. Write down the reasons why you are quitting. Keep this list in places where you will see it often. Tell your family, friends, and co-workers that you are quitting. Their support is important. Talk with your doctor about the choices that may help you quit. Find out if your health insurance will pay for these treatments. Know the people, places, things, and activities that make you want to smoke (triggers). Avoid them. What first steps can I take to quit smoking? Throw away all cigarettes at home, at work, and in your car. Throw away the things that you use when you smoke, such as ashtrays and lighters. Clean your car. Empty the ashtray. Clean your home, including curtains and carpets. What can I do to help me  quit smoking? Talk with your doctor about taking medicines and seeing a counselor. You are more likely  to succeed when you do both. If you are pregnant or breastfeeding: Talk with your doctor about counseling or other ways to quit smoking. Do not take medicine to help you quit smoking unless your doctor tells you to. Quit right away Quit smoking completely, instead of slowly cutting back on how much you smoke over a period of time. Stopping smoking right away may be more successful than slowly quitting. Go to counseling. In-person is best if this is an option. You are more likely to quit if you go to counseling sessions regularly. Take medicine You may take medicines to help you quit. Some medicines need a prescription, and some you can buy over-the-counter. Some medicines may contain a drug called nicotine to replace the nicotine in cigarettes. Medicines may: Help you stop having the desire to smoke (cravings). Help to stop the problems that come when you stop smoking (withdrawal symptoms). Your doctor may ask you to use: Nicotine patches, gum, or lozenges. Nicotine inhalers or sprays. Non-nicotine medicine that you take by mouth. Find resources Find resources and other ways to help you quit smoking and remain smoke-free after you quit. They include: Online chats with a Social worker. Phone quitlines. Printed Furniture conservator/restorer. Support groups or group counseling. Text messaging programs. Mobile phone apps. Use apps on your mobile phone or tablet that can help you stick to your quit plan. Examples of free services include Quit Guide from the CDC and smokefree.gov  What can I do to make it easier to quit?  Talk to your family and friends. Ask them to support and encourage you. Call a phone quitline, such as 1-800-QUIT-NOW, reach out to support groups, or work with a Social worker. Ask people who smoke to not smoke around you. Avoid places that make you want to smoke, such as: Bars. Parties. Smoke-break areas at work. Spend time with people who do not smoke. Lower the stress in your life.  Stress can make you want to smoke. Try these things to lower stress: Getting regular exercise. Doing deep-breathing exercises. Doing yoga. Meditating. What benefits will I see if I quit smoking? Over time, you may have: A better sense of smell and taste. Less coughing and sore throat. A slower heart rate. Lower blood pressure. Clearer skin. Better breathing. Fewer sick days. Summary Quitting smoking can be hard, but it is one of the best things that you can do for your health. Do not give up if you cannot quit the first time. Some people need to try many times to quit. When you decide to quit smoking, make a plan to help you succeed. Quit smoking right away, not slowly over a period of time. When you start quitting, get help and support to keep you smoke-free. This information is not intended to replace advice given to you by your health care provider. Make sure you discuss any questions you have with your health care provider. Document Revised: 10/19/2021 Document Reviewed: 10/19/2021 Elsevier Patient Education  [place].    Adopting a Healthy Lifestyle.  Know what a healthy weight is for you (roughly BMI <25) and aim to maintain this   Aim for 7+ servings of fruits and vegetables daily   65-80+ fluid ounces of water or unsweet tea for healthy kidneys   Limit to max 1 drink of alcohol per day; avoid smoking/tobacco   Limit animal fats in diet for cholesterol and heart  health - choose grass fed whenever available   Avoid highly processed foods, and foods high in saturated/trans fats   Aim for low stress - take time to unwind and care for your mental health   Aim for 150 min of moderate intensity exercise weekly for heart health, and weights twice weekly for bone health   Aim for 7-9 hours of sleep daily   When it comes to diets, agreement about the perfect plan isnt easy to find, even among the experts. Experts at the [employer] developed  an idea known as the Healthy Eating Plate. Just imagine a plate divided into logical, healthy portions.   The emphasis is on diet quality:   Load up on vegetables and fruits - one-half of your plate: Aim for color and variety, and remember that potatoes dont count.   Go for whole grains - one-quarter of your plate: Whole wheat, barley, wheat berries, quinoa, oats, brown rice, and foods made with them. If you want pasta, go with whole wheat pasta.   Protein power - one-quarter of your plate: Fish, chicken, beans, and nuts are all healthy, versatile protein sources. Limit red meat.   The diet, however, does go beyond the plate, offering a few other suggestions.   Use healthy plant oils, such as olive, canola, soy, corn, sunflower and peanut. Check the labels, and avoid partially hydrogenated oil, which have unhealthy trans fats.   If youre thirsty, drink water. Coffee and tea are good in moderation, but skip sugary drinks and limit milk and dairy products to one or two daily servings.   The type of carbohydrate in the diet is more important than the amount. Some sources of carbohydrates, such as vegetables, fruits, whole grains, and beans-are healthier than others.   Finally, stay active  Signed, Berniece Salines, DO  11/30/2022 10:07 PM    [place]

## 2022-11-30 DIAGNOSIS — F17200 Nicotine dependence, unspecified, uncomplicated: Secondary | ICD-10-CM | POA: Insufficient documentation

## 2022-11-30 DIAGNOSIS — Z13220 Encounter for screening for lipoid disorders: Secondary | ICD-10-CM | POA: Insufficient documentation

## 2022-11-30 DIAGNOSIS — I10 Essential (primary) hypertension: Secondary | ICD-10-CM | POA: Insufficient documentation

## 2022-11-30 LAB — BASIC METABOLIC PANEL
BUN/Creatinine Ratio: 25 — ABNORMAL HIGH (ref 9–23)
BUN: 18 mg/dL (ref 6–24)
CO2: 25 mmol/L (ref 20–29)
Calcium: 10.2 mg/dL (ref 8.7–10.2)
Chloride: 104 mmol/L (ref 96–106)
Creatinine, Ser: 0.72 mg/dL (ref 0.57–1.00)
Glucose: 89 mg/dL (ref 70–99)
Potassium: 4.6 mmol/L (ref 3.5–5.2)
Sodium: 141 mmol/L (ref 134–144)
eGFR: 102 mL/min/{1.73_m2} (ref >59)

## 2022-11-30 LAB — HEMOGLOBIN A1C
Est. average glucose Bld gHb Est-mCnc: 128 mg/dL
Hgb A1c MFr Bld: 6.1 % — ABNORMAL HIGH (ref 4.8–5.6)

## 2022-11-30 LAB — MAGNESIUM: Magnesium: 1.9 mg/dL (ref 1.6–2.3)

## 2022-11-30 LAB — LIPID PANEL
Chol/HDL Ratio: 3.9 ratio (ref 0.0–4.4)
Cholesterol, Total: 191 mg/dL (ref 100–199)
HDL: 49 mg/dL (ref >39)
LDL Chol Calc (NIH): 129 mg/dL — ABNORMAL HIGH (ref 0–99)
Triglycerides: 73 mg/dL (ref 0–149)
VLDL Cholesterol Cal: 13 mg/dL (ref 5–40)

## 2022-12-05 ENCOUNTER — Telehealth: Payer: Self-pay | Admitting: Cardiology

## 2022-12-05 MED ORDER — ROSUVASTATIN CALCIUM 5 MG PO TABS: 5.0000 mg | ORAL_TABLET | Freq: Every day | ORAL | 3 refills | Status: DC

## 2022-12-05 NOTE — Telephone Encounter (Signed)
The patient has been notified of the result and verbalized understanding.  All questions (if any) were answered. She was agreeable to start Rosuvastatin 5 mg daily  Medication e-sent to pharmacy.  Raiford Simmonds, RN 12/05/2022 5:03 PM

## 2022-12-05 NOTE — Telephone Encounter (Signed)
Your hemoglobin A1c is 6.1 suggesting prediabetes-lipid profile showed evidence of high LDL 129.  If you are agreeable I like to start you on Crestor 5 mg daily to help with the cholesterol.

## 2022-12-05 NOTE — Telephone Encounter (Signed)
Patient returned call for her lab results. °

## 2022-12-06 ENCOUNTER — Telehealth (HOSPITAL_COMMUNITY): Payer: Self-pay | Admitting: Emergency Medicine

## 2022-12-06 NOTE — Telephone Encounter (Signed)
Reaching out to patient to offer assistance regarding upcoming cardiac imaging study; pt verbalizes understanding of appt date/time, parking situation and where to check in, pre-test NPO status and medications ordered, and verified current allergies; name and call back number provided for further questions should they arise Stacy Bond RN Navigator Cardiac Imaging [facility] [phone] office [phone] cell  Arrival 1[address] entrance 100mg  metoprolol tartrate  Denies iv issues Aware contrast

## 2022-12-09 ENCOUNTER — Ambulatory Visit (HOSPITAL_COMMUNITY)
Admission: RE | Admit: 2022-12-09 | Discharge: 2022-12-09 | Disposition: A | Discharge status: Home / Self Care | Payer: Managed Care, Other (non HMO) | Source: Ambulatory Visit | Attending: Cardiology | Admitting: Cardiology

## 2022-12-09 DIAGNOSIS — R079 Chest pain, unspecified: Secondary | ICD-10-CM

## 2022-12-09 MED ORDER — NITROGLYCERIN 0.4 MG SL SUBL
0.8000 mg | SUBLINGUAL_TABLET | Freq: Once | SUBLINGUAL | Status: AC
  Administered 2022-12-09: 0.8 mg via SUBLINGUAL

## 2022-12-09 MED ORDER — IOHEXOL 350 MG/ML SOLN
100.0000 mL | Freq: Once | INTRAVENOUS | Status: AC | PRN
  Administered 2022-12-09: 100 mL via INTRAVENOUS

## 2022-12-09 MED ORDER — NITROGLYCERIN 0.4 MG SL SUBL
SUBLINGUAL_TABLET | SUBLINGUAL | Status: AC
  Filled 2022-12-09: qty 2

## 2023-01-18 IMAGING — MG MM DIGITAL SCREENING BILAT W/ TOMO AND CAD
8 series · 8 of 24 positions shown · non-contrast
Comparison: None.

CLINICAL DATA: Screening.

EXAM:
DIGITAL SCREENING BILATERAL MAMMOGRAM WITH TOMOSYNTHESIS AND CAD
TECHNIQUE: Bilateral screening digital craniocaudal and mediolateral oblique
mammograms were obtained. Bilateral screening digital breast
tomosynthesis was performed. The images were evaluated with
computer-aided detection.

[R CC synth-2D]
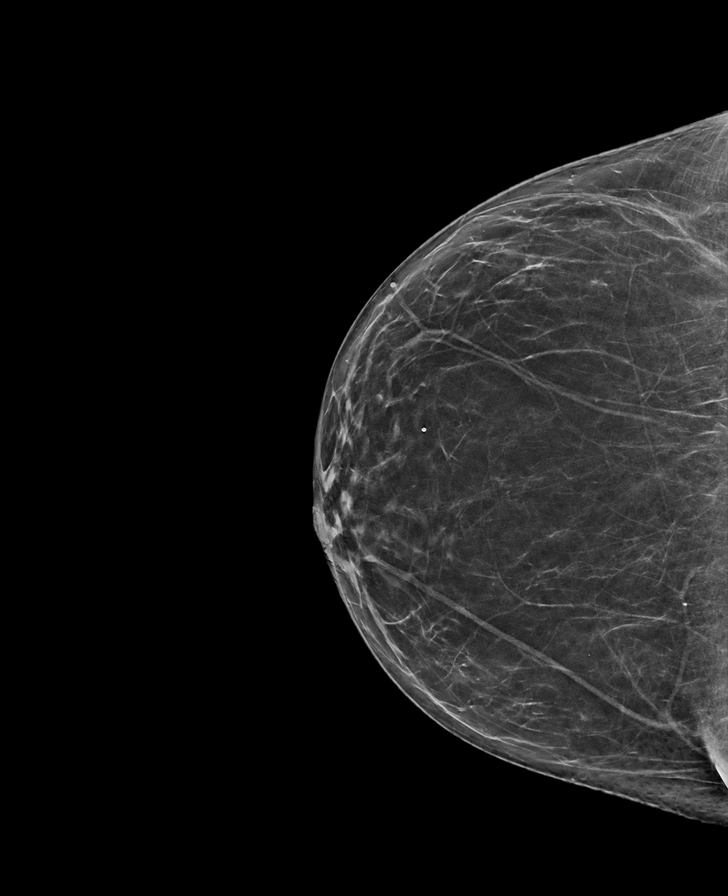

[R MLO synth-2D]
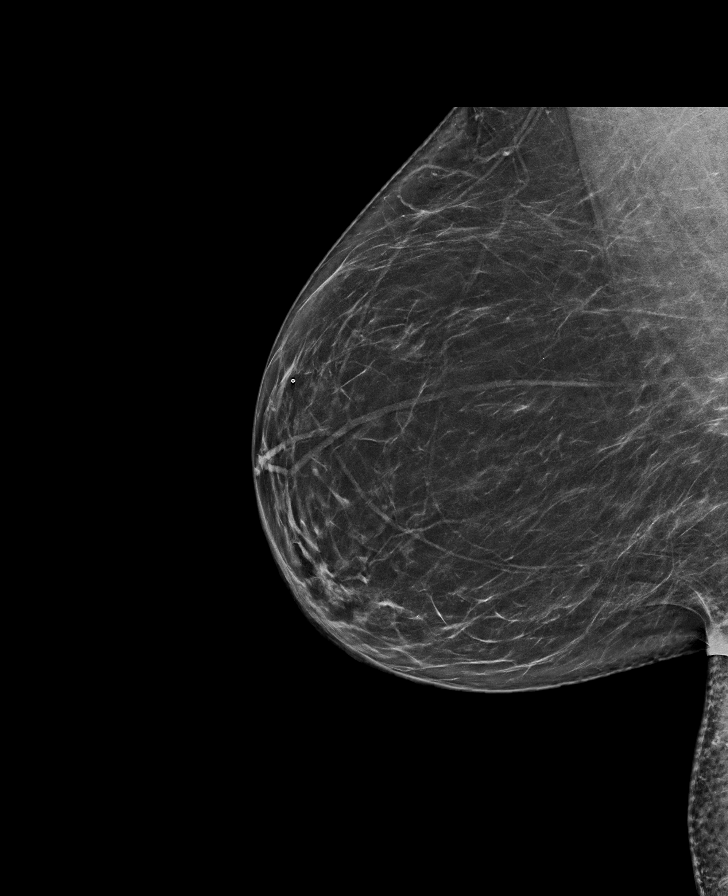

[L MLO synth-2D]
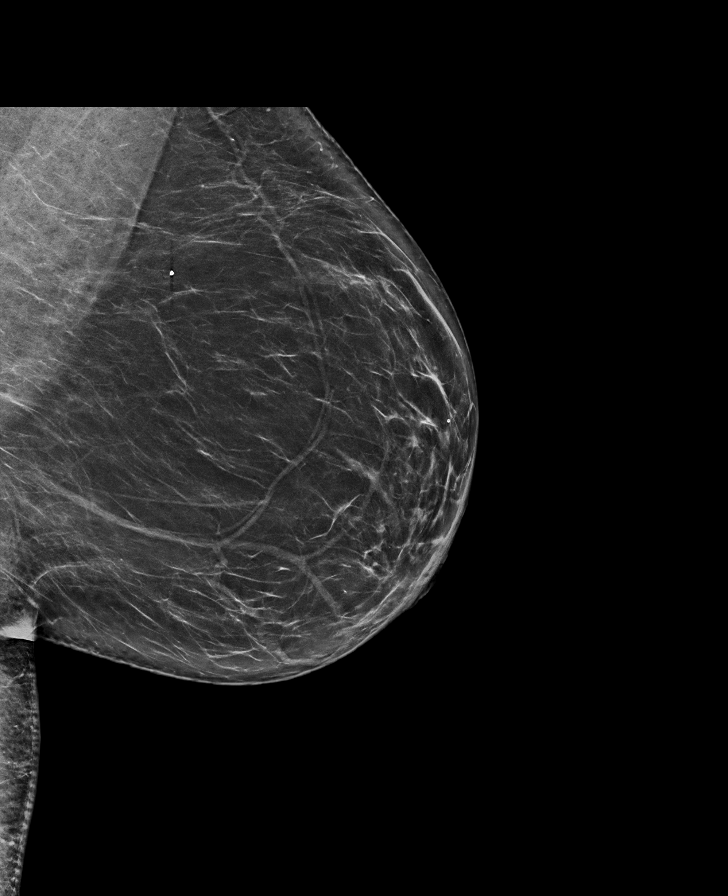

[L CC synth-2D]
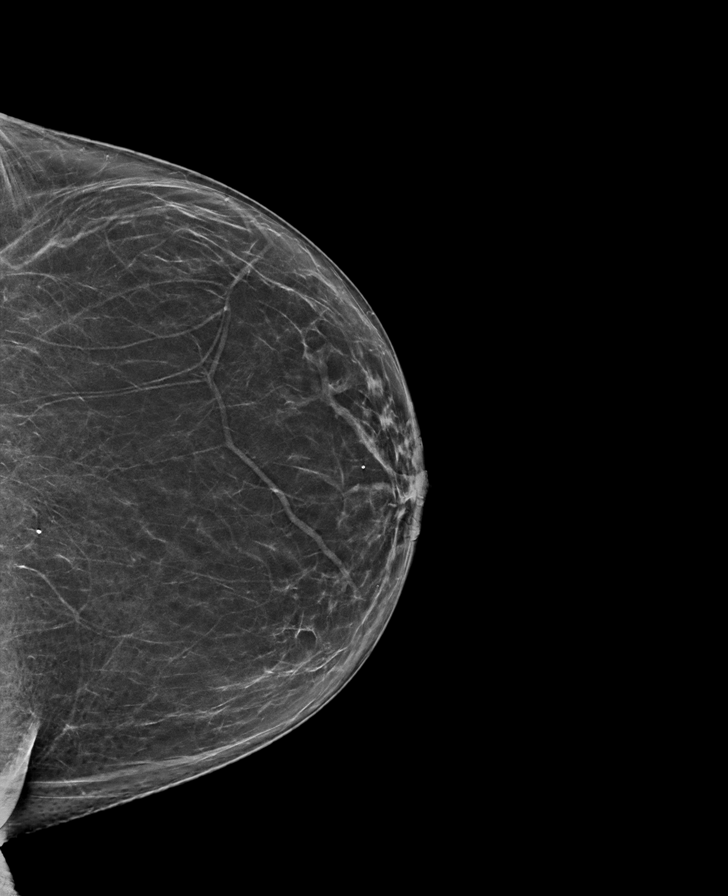

[R CC tomo · tomo slice 35/69.0]
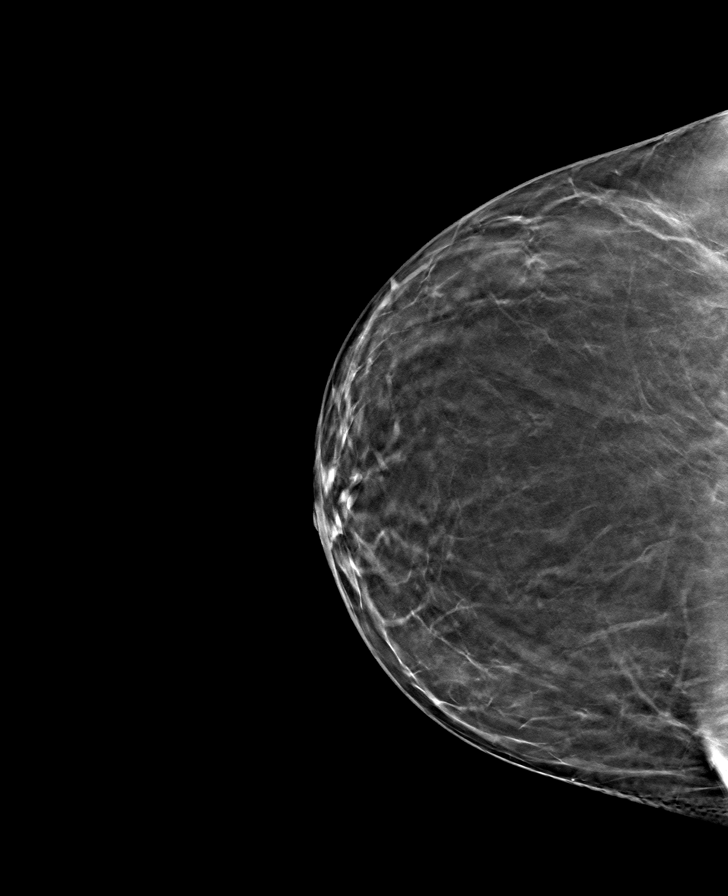

[L MLO tomo · tomo slice 42/83.0]
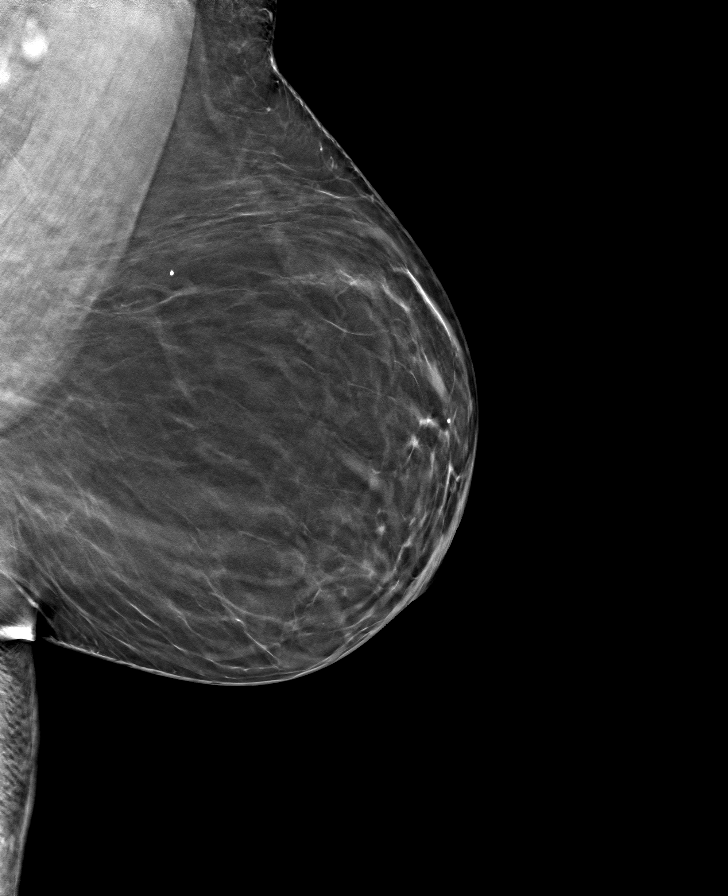

[R MLO tomo · tomo slice 39/78.0]
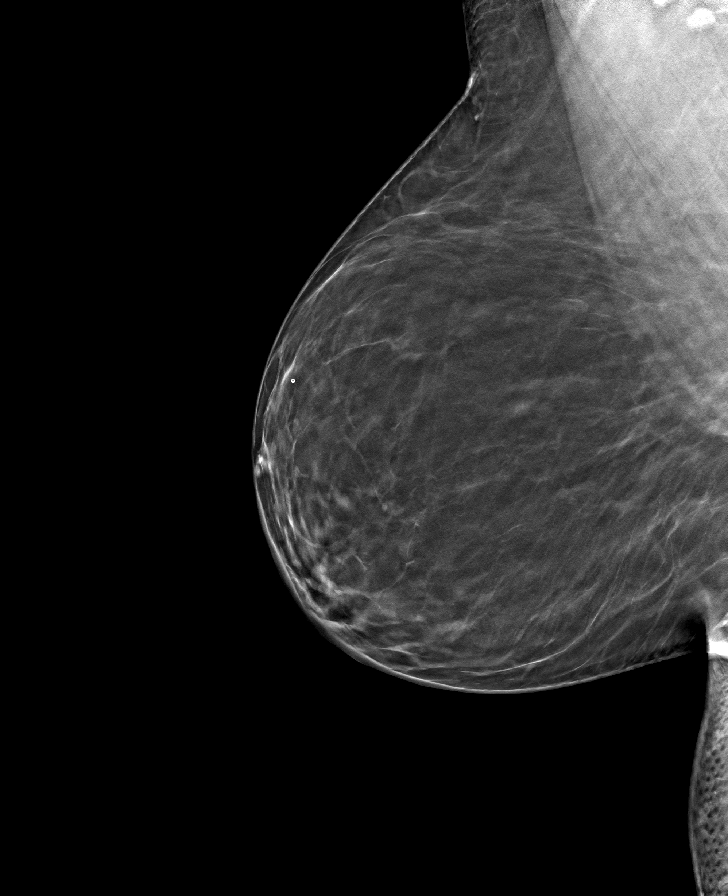

[L CC tomo · tomo slice 35/70.0]
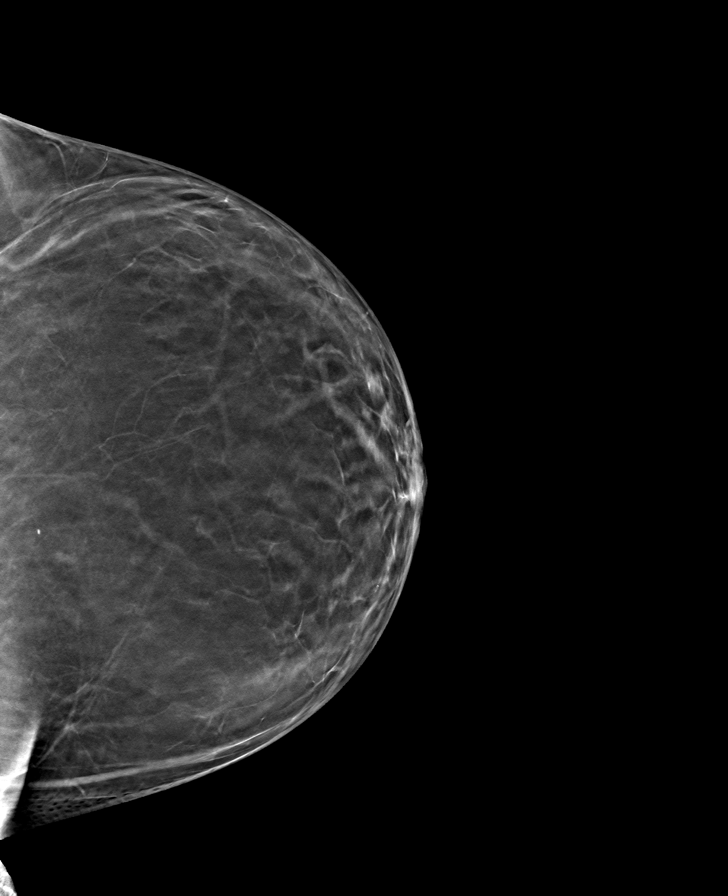

[8 of 24 positions shown; findings below may reference images not displayed]

ACR Breast Density Category b: There are scattered areas of
fibroglandular density.
FINDINGS: There are no findings suspicious for malignancy.
IMPRESSION: No mammographic evidence of malignancy. A result letter of this
screening mammogram will be mailed directly to the patient.

RECOMMENDATION:
Screening mammogram in one year. (Code:XG-X-X7B)

BI-RADS CATEGORY  1: Negative.

## 2023-03-21 ENCOUNTER — Encounter: Payer: Self-pay | Admitting: Cardiology

## 2023-03-21 ENCOUNTER — Ambulatory Visit: Payer: Managed Care, Other (non HMO) | Attending: Cardiology | Admitting: Cardiology

## 2023-03-21 VITALS — BP 132/74 | HR 82 | Ht 65.5 in | Wt 233.0 lb

## 2023-03-21 DIAGNOSIS — I10 Essential (primary) hypertension: Secondary | ICD-10-CM

## 2023-03-21 DIAGNOSIS — F17200 Nicotine dependence, unspecified, uncomplicated: Secondary | ICD-10-CM | POA: Diagnosis not present

## 2023-03-21 NOTE — Patient Instructions (Signed)
Medication Instructions:  Your physician recommends that you continue on your current medications as directed. Please refer to the Current Medication list given to you today.   Please take your blood pressure daily for 2 weeks and send in a MyChart message. Please include heart rates.   HOW TO TAKE YOUR BLOOD PRESSURE: Rest 5 minutes before taking your blood pressure. Don't smoke or drink caffeinated beverages for at least 30 minutes before. Take your blood pressure before (not after) you eat. Sit comfortably with your back supported and both feet on the floor (don't cross your legs). Elevate your arm to heart level on a table or a desk. Use the proper sized cuff. It should fit smoothly and snugly around your bare upper arm. There should be enough room to slip a fingertip under the cuff. The bottom edge of the cuff should be 1 inch above the crease of the elbow. Ideally, take 3 measurements at one sitting and record the average.  *If you need a refill on your cardiac medications before your next appointment, please call your pharmacy*   Lab Work: None   Testing/Procedures: None   Follow-Up: At [facility], you and your health needs are our priority.  As part of our continuing mission to provide you with exceptional heart care, we have created designated Provider Care Teams.  These Care Teams include your primary Cardiologist (physician) and Advanced Practice Providers (APPs -  Physician Assistants and Nurse Practitioners) who all work together to provide you with the care you need, when you need it.  We recommend signing up for the patient portal called "MyChart".  Sign up information is provided on this After Visit Summary.  MyChart is used to connect with patients for Virtual Visits (Telemedicine).  Patients are able to view lab/test results, encounter notes, upcoming appointments, etc.  Non-urgent messages can be sent to your provider as well.   To learn more about what you  can do with MyChart, go to [email or web address].    Your next appointment:   6 month(s)  Provider:   Thomasene Ripple, DO     Other Instructions 1-800-QUIT-NOW [phone])   If you interested in 1-1 telephonic tobacco cessation coaching with [employer], please call [phone] to enroll today.   Steps to Quit Smoking Smoking tobacco is the leading cause of preventable death. It can affect almost every organ in the body. Smoking puts you and people around you at risk for many serious, long-lasting (chronic) diseases. Quitting smoking can be hard, but it is one of the best things that you can do for your health. It is never too late to quit. Do not give up if you cannot quit the first time. Some people need to try many times to quit. Do your best to stick to your quit plan, and talk with your doctor if you have any questions or concerns. How do I get ready to quit? Pick a date to quit. Set a date within the next 2 weeks to give you time to prepare. Write down the reasons why you are quitting. Keep this list in places where you will see it often. Tell your family, friends, and co-workers that you are quitting. Their support is important. Talk with your doctor about the choices that may help you quit. Find out if your health insurance will pay for these treatments. Know the people, places, things, and activities that make you want to smoke (triggers). Avoid them. What first steps can I take to quit  smoking? Throw away all cigarettes at home, at work, and in your car. Throw away the things that you use when you smoke, such as ashtrays and lighters. Clean your car. Empty the ashtray. Clean your home, including curtains and carpets. What can I do to help me quit smoking? Talk with your doctor about taking medicines and seeing a counselor. You are more likely to succeed when you do both. If you are pregnant or breastfeeding: Talk with your doctor about counseling or other  ways to quit smoking. Do not take medicine to help you quit smoking unless your doctor tells you to. Quit right away Quit smoking completely, instead of slowly cutting back on how much you smoke over a period of time. Stopping smoking right away may be more successful than slowly quitting. Go to counseling. In-person is best if this is an option. You are more likely to quit if you go to counseling sessions regularly. Take medicine You may take medicines to help you quit. Some medicines need a prescription, and some you can buy over-the-counter. Some medicines may contain a drug called nicotine to replace the nicotine in cigarettes. Medicines may: Help you stop having the desire to smoke (cravings). Help to stop the problems that come when you stop smoking (withdrawal symptoms). Your doctor may ask you to use: Nicotine patches, gum, or lozenges. Nicotine inhalers or sprays. Non-nicotine medicine that you take by mouth. Find resources Find resources and other ways to help you quit smoking and remain smoke-free after you quit. They include: Online chats with a Veterinary surgeon. Phone quitlines. Printed Materials engineer. Support groups or group counseling. Text messaging programs. Mobile phone apps. Use apps on your mobile phone or tablet that can help you stick to your quit plan. Examples of free services include Quit Guide from the CDC and smokefree.gov  What can I do to make it easier to quit?  Talk to your family and friends. Ask them to support and encourage you. Call a phone quitline, such as 1-800-QUIT-NOW, reach out to support groups, or work with a Veterinary surgeon. Ask people who smoke to not smoke around you. Avoid places that make you want to smoke, such as: Bars. Parties. Smoke-break areas at work. Spend time with people who do not smoke. Lower the stress in your life. Stress can make you want to smoke. Try these things to lower stress: Getting regular exercise. Doing deep-breathing  exercises. Doing yoga. Meditating. What benefits will I see if I quit smoking? Over time, you may have: A better sense of smell and taste. Less coughing and sore throat. A slower heart rate. Lower blood pressure. Clearer skin. Better breathing. Fewer sick days. Summary Quitting smoking can be hard, but it is one of the best things that you can do for your health. Do not give up if you cannot quit the first time. Some people need to try many times to quit. When you decide to quit smoking, make a plan to help you succeed. Quit smoking right away, not slowly over a period of time. When you start quitting, get help and support to keep you smoke-free. This information is not intended to replace advice given to you by your health care provider. Make sure you discuss any questions you have with your health care provider. Document Revised: 10/19/2021 Document Reviewed: 10/19/2021 Elsevier Patient Education  2023 [employer].

## 2023-03-21 NOTE — Progress Notes (Signed)
Cardiology Office Note:    Date:  03/21/2023   ID:  Stacy Cooper, DOB 07/10/1971, MRN [id number]  PCP:  Norm Salt, PA  Cardiologist:  Thomasene Ripple, DO  Electrophysiologist:  None   Referring MD: Jackie Plum, MD   " I am experiencing pain"  History of Present Illness:    Stacy Cooper is a 52 y.o. female with a hx of    At her last visit I sent the patient for coronary CTA which came back with no evidence of coronary disease.  Started on amlodipine 2.5 mg daily.  Since her visit she tells me she has had diffuse pain and is pending workup for rheumatoid arthritis or lupus.  No complaints at this time.  Past Medical History:  Diagnosis Date   Anemia     Past Surgical History:  Procedure Laterality Date   CESAREAN SECTION     TUBAL LIGATION      Current Medications: Current Meds  Medication Sig   acetaminophen (TYLENOL) 650 MG CR tablet Tylenol Arthritis Extended Release   amLODipine (NORVASC) 2.5 MG tablet Take 1 tablet (2.5 mg total) by mouth daily.   lidocaine (HM LIDOCAINE PATCH) 4 % Place 1 patch onto the skin daily.   metoprolol tartrate (LOPRESSOR) 100 MG tablet Take 2 hours prior to CT   rosuvastatin (CRESTOR) 5 MG tablet Take 1 tablet (5 mg total) by mouth daily.     Allergies:   Codeine, Shellfish allergy, and Prednisone   Social History   Socioeconomic History   Marital status: Married    Spouse name: Not on file   Number of children: Not on file   Years of education: Not on file   Highest education level: Not on file  Occupational History   Not on file  Tobacco Use   Smoking status: Every Day    Packs/day: .25    Types: Cigarettes   Smokeless tobacco: Never  Vaping Use   Vaping Use: Never used  Substance and Sexual Activity   Alcohol use: No   Drug use: No   Sexual activity: Yes    Birth control/protection: None  Other Topics Concern   Not on file  Social History Narrative   Not on file   Social  Determinants of Health   Financial Resource Strain: Low Risk  (11/22/2018)   Overall Financial Resource Strain (CARDIA)    Difficulty of Paying Living Expenses: Not hard at all  Food Insecurity: No Food Insecurity (11/22/2018)   Hunger Vital Sign    Worried About Running Out of Food in the Last Year: Never true    Ran Out of Food in the Last Year: Never true  Transportation Needs: No Transportation Needs (11/22/2018)   PRAPARE - Administrator, Civil Service (Medical): No    Lack of Transportation (Non-Medical): No  Physical Activity: Inactive (11/22/2018)   Exercise Vital Sign    Days of Exercise per Week: 0 days    Minutes of Exercise per Session: 0 min  Stress: Stress Concern Present (11/22/2018)   [employer] of Occupational Health - Occupational Stress Questionnaire    Feeling of Stress : Very much  Social Connections: Somewhat Isolated (11/22/2018)   Social Connection and Isolation Panel [NHANES]    Frequency of Communication with Friends and Family: Once a week    Frequency of Social Gatherings with Friends and Family: Once a week    Attends Religious Services: 1 to 4 times per year    Active  Member of Clubs or Organizations: No    Attends Engineer, structural: Not asked    Marital Status: Married     Family History: The patient's family history is not on file.  ROS:   Review of Systems  Constitution: Negative for decreased appetite, fever and weight gain.  HENT: Negative for congestion, ear discharge, hoarse voice and sore throat.   Eyes: Negative for discharge, redness, vision loss in right eye and visual halos.  Cardiovascular: Negative for chest pain, dyspnea on exertion, leg swelling, orthopnea and palpitations.  Respiratory: Negative for cough, hemoptysis, shortness of breath and snoring.   Endocrine: Negative for heat intolerance and polyphagia.  Hematologic/Lymphatic: Negative for bleeding problem. Does not bruise/bleed easily.  Skin:  Negative for flushing, nail changes, rash and suspicious lesions.  Musculoskeletal: Negative for arthritis, joint pain, muscle cramps, myalgias, neck pain and stiffness.  Gastrointestinal: Negative for abdominal pain, bowel incontinence, diarrhea and excessive appetite.  Genitourinary: Negative for decreased libido, genital sores and incomplete emptying.  Neurological: Negative for brief paralysis, focal weakness, headaches and loss of balance.  Psychiatric/Behavioral: Negative for altered mental status, depression and suicidal ideas.  Allergic/Immunologic: Negative for HIV exposure and persistent infections.    EKGs/Labs/Other Studies Reviewed:    The following studies were reviewed today:   EKG:  The ekg ordered today demonstrates   Recent Labs: 11/07/2022: Hemoglobin 12.5; Platelets 348 11/29/2022: BUN 18; Creatinine, Ser 0.72; Magnesium 1.9; Potassium 4.6; Sodium 141  Recent Lipid Panel    Component Value Date/Time   CHOL 191 11/29/2022 1140   TRIG 73 11/29/2022 1140   HDL 49 11/29/2022 1140   CHOLHDL 3.9 11/29/2022 1140   CHOLHDL 2.8 11/23/2018 0650   VLDL 17 11/23/2018 0650   LDLCALC 129 (H) 11/29/2022 1140    Physical Exam:    VS:  BP 132/74 (BP Location: Right Arm)   Pulse 82   Ht 5' 5.5" (1.664 m)   Wt 233 lb (105.7 kg)   SpO2 99%   BMI 38.18 kg/m     Wt Readings from Last 3 Encounters:  03/21/23 233 lb (105.7 kg)  11/29/22 205 lb (93 kg)  11/07/22 200 lb (90.7 kg)     GEN: Well nourished, well developed in no acute distress HEENT: Normal NECK: No JVD; No carotid bruits LYMPHATICS: No lymphadenopathy CARDIAC: S1S2 noted,RRR, no murmurs, rubs, gallops RESPIRATORY:  Clear to auscultation without rales, wheezing or rhonchi  ABDOMEN: Soft, non-tender, non-distended, +bowel sounds, no guarding. EXTREMITIES: No edema, No cyanosis, no clubbing MUSCULOSKELETAL:  No deformity  SKIN: Warm and dry NEUROLOGIC:  Alert and oriented x 3, non-focal PSYCHIATRIC:   Normal affect, good insight  ASSESSMENT:    1. Primary hypertension   2. Smoker   3. Morbid obesity (HCC)    PLAN:    The CT scan does not show any evidence of coronary artery disease no need for any further ischemic evaluation.  Slightly elevated in the office for blood pressure.  Will continue her current medication regimen.  I have asked the patient take her blood pressure send me that information in 2 weeks via MyChart.  Given information for smoking cessation.  The patient understands the need to lose weight with diet and exercise. We have discussed specific strategies for this.  Talked to the African-American heart study with the patient.  She is agreeable to be enrolled in the study.  The patient is in agreement with the above plan. The patient left the office in stable condition.  The patient will follow up in 6 months or sooner if needed.   Medication Adjustments/Labs and Tests Ordered: Current medicines are reviewed at length with the patient today.  Concerns regarding medicines are outlined above.  No orders of the defined types were placed in this encounter.  No orders of the defined types were placed in this encounter.   Patient Instructions  Medication Instructions:  Your physician recommends that you continue on your current medications as directed. Please refer to the Current Medication list given to you today.   Please take your blood pressure daily for 2 weeks and send in a MyChart message. Please include heart rates.   HOW TO TAKE YOUR BLOOD PRESSURE: Rest 5 minutes before taking your blood pressure. Don't smoke or drink caffeinated beverages for at least 30 minutes before. Take your blood pressure before (not after) you eat. Sit comfortably with your back supported and both feet on the floor (don't cross your legs). Elevate your arm to heart level on a table or a desk. Use the proper sized cuff. It should fit smoothly and snugly around your bare upper  arm. There should be enough room to slip a fingertip under the cuff. The bottom edge of the cuff should be 1 inch above the crease of the elbow. Ideally, take 3 measurements at one sitting and record the average.  *If you need a refill on your cardiac medications before your next appointment, please call your pharmacy*   Lab Work: None   Testing/Procedures: None   Follow-Up: At [facility], you and your health needs are our priority.  As part of our continuing mission to provide you with exceptional heart care, we have created designated Provider Care Teams.  These Care Teams include your primary Cardiologist (physician) and Advanced Practice Providers (APPs -  Physician Assistants and Nurse Practitioners) who all work together to provide you with the care you need, when you need it.  We recommend signing up for the patient portal called "MyChart".  Sign up information is provided on this After Visit Summary.  MyChart is used to connect with patients for Virtual Visits (Telemedicine).  Patients are able to view lab/test results, encounter notes, upcoming appointments, etc.  Non-urgent messages can be sent to your provider as well.   To learn more about what you can do with MyChart, go to [email or web address].    Your next appointment:   6 month(s)  Provider:   Thomasene Ripple, DO     Other Instructions 1-800-QUIT-NOW [phone])   If you interested in 1-1 telephonic tobacco cessation coaching with [employer], please call [phone] to enroll today.   Steps to Quit Smoking Smoking tobacco is the leading cause of preventable death. It can affect almost every organ in the body. Smoking puts you and people around you at risk for many serious, long-lasting (chronic) diseases. Quitting smoking can be hard, but it is one of the best things that you can do for your health. It is never too late to quit. Do not give up if you cannot quit the first time. Some  people need to try many times to quit. Do your best to stick to your quit plan, and talk with your doctor if you have any questions or concerns. How do I get ready to quit? Pick a date to quit. Set a date within the next 2 weeks to give you time to prepare. Write down the reasons why you are quitting. Keep this list in places where  you will see it often. Tell your family, friends, and co-workers that you are quitting. Their support is important. Talk with your doctor about the choices that may help you quit. Find out if your health insurance will pay for these treatments. Know the people, places, things, and activities that make you want to smoke (triggers). Avoid them. What first steps can I take to quit smoking? Throw away all cigarettes at home, at work, and in your car. Throw away the things that you use when you smoke, such as ashtrays and lighters. Clean your car. Empty the ashtray. Clean your home, including curtains and carpets. What can I do to help me quit smoking? Talk with your doctor about taking medicines and seeing a counselor. You are more likely to succeed when you do both. If you are pregnant or breastfeeding: Talk with your doctor about counseling or other ways to quit smoking. Do not take medicine to help you quit smoking unless your doctor tells you to. Quit right away Quit smoking completely, instead of slowly cutting back on how much you smoke over a period of time. Stopping smoking right away may be more successful than slowly quitting. Go to counseling. In-person is best if this is an option. You are more likely to quit if you go to counseling sessions regularly. Take medicine You may take medicines to help you quit. Some medicines need a prescription, and some you can buy over-the-counter. Some medicines may contain a drug called nicotine to replace the nicotine in cigarettes. Medicines may: Help you stop having the desire to smoke (cravings). Help to stop the  problems that come when you stop smoking (withdrawal symptoms). Your doctor may ask you to use: Nicotine patches, gum, or lozenges. Nicotine inhalers or sprays. Non-nicotine medicine that you take by mouth. Find resources Find resources and other ways to help you quit smoking and remain smoke-free after you quit. They include: Online chats with a Veterinary surgeon. Phone quitlines. Printed Materials engineer. Support groups or group counseling. Text messaging programs. Mobile phone apps. Use apps on your mobile phone or tablet that can help you stick to your quit plan. Examples of free services include Quit Guide from the CDC and smokefree.gov  What can I do to make it easier to quit?  Talk to your family and friends. Ask them to support and encourage you. Call a phone quitline, such as 1-800-QUIT-NOW, reach out to support groups, or work with a Veterinary surgeon. Ask people who smoke to not smoke around you. Avoid places that make you want to smoke, such as: Bars. Parties. Smoke-break areas at work. Spend time with people who do not smoke. Lower the stress in your life. Stress can make you want to smoke. Try these things to lower stress: Getting regular exercise. Doing deep-breathing exercises. Doing yoga. Meditating. What benefits will I see if I quit smoking? Over time, you may have: A better sense of smell and taste. Less coughing and sore throat. A slower heart rate. Lower blood pressure. Clearer skin. Better breathing. Fewer sick days. Summary Quitting smoking can be hard, but it is one of the best things that you can do for your health. Do not give up if you cannot quit the first time. Some people need to try many times to quit. When you decide to quit smoking, make a plan to help you succeed. Quit smoking right away, not slowly over a period of time. When you start quitting, get help and support to keep you smoke-free. This  information is not intended to replace advice given to  you by your health care provider. Make sure you discuss any questions you have with your health care provider. Document Revised: 10/19/2021 Document Reviewed: 10/19/2021 Elsevier Patient Education  2023 Elsevier Inc[employer]     Adopting a Healthy Lifestyle.  Know what a healthy weight is for you (roughly BMI <25) and aim to maintain this   Aim for 7+ servings of fruits and vegetables daily   65-80+ fluid ounces of water or unsweet tea for healthy kidneys   Limit to max 1 drink of alcohol per day; avoid smoking/tobacco   Limit animal fats in diet for cholesterol and heart health - choose grass fed whenever available   Avoid highly processed foods, and foods high in saturated/trans fats   Aim for low stress - take time to unwind and care for your mental health   Aim for 150 min of moderate intensity exercise weekly for heart health, and weights twice weekly for bone health   Aim for 7-9 hours of sleep daily   When it comes to diets, agreement about the perfect plan isnt easy to find, even among the experts. Experts at the [employer] developed an idea known as the Healthy Eating Plate. Just imagine a plate divided into logical, healthy portions.   The emphasis is on diet quality:   Load up on vegetables and fruits - one-half of your plate: Aim for color and variety, and remember that potatoes dont count.   Go for whole grains - one-quarter of your plate: Whole wheat, barley, wheat berries, quinoa, oats, brown rice, and foods made with them. If you want pasta, go with whole wheat pasta.   Protein power - one-quarter of your plate: Fish, chicken, beans, and nuts are all healthy, versatile protein sources. Limit red meat.   The diet, however, does go beyond the plate, offering a few other suggestions.   Use healthy plant oils, such as olive, canola, soy, corn, sunflower and peanut. Check the labels, and avoid partially hydrogenated oil, which have unhealthy trans  fats.   If youre thirsty, drink water. Coffee and tea are good in moderation, but skip sugary drinks and limit milk and dairy products to one or two daily servings.   The type of carbohydrate in the diet is more important than the amount. Some sources of carbohydrates, such as vegetables, fruits, whole grains, and beans-are healthier than others.   Finally, stay active  Signed, Thomasene Ripple, DO  03/21/2023 8:07 PM    [facility]

## 2023-03-25 ENCOUNTER — Encounter: Payer: Self-pay | Admitting: Cardiology

## 2023-03-31 ENCOUNTER — Other Ambulatory Visit (HOSPITAL_COMMUNITY)
Admission: RE | Admit: 2023-03-31 | Discharge: 2023-03-31 | Disposition: A | Discharge status: Home / Self Care | Payer: Managed Care, Other (non HMO) | Source: Ambulatory Visit | Attending: Advanced Practice Midwife | Admitting: Advanced Practice Midwife

## 2023-03-31 ENCOUNTER — Ambulatory Visit: Payer: Managed Care, Other (non HMO) | Admitting: Licensed Clinical Social Worker

## 2023-03-31 ENCOUNTER — Encounter: Payer: Self-pay | Admitting: Advanced Practice Midwife

## 2023-03-31 ENCOUNTER — Ambulatory Visit: Payer: Managed Care, Other (non HMO) | Admitting: Advanced Practice Midwife

## 2023-03-31 VITALS — BP 147/91 | HR 74 | Ht 65.0 in | Wt 234.8 lb

## 2023-03-31 DIAGNOSIS — Z1231 Encounter for screening mammogram for malignant neoplasm of breast: Secondary | ICD-10-CM

## 2023-03-31 DIAGNOSIS — Z1331 Encounter for screening for depression: Secondary | ICD-10-CM

## 2023-03-31 DIAGNOSIS — Z01419 Encounter for gynecological examination (general) (routine) without abnormal findings: Secondary | ICD-10-CM

## 2023-03-31 DIAGNOSIS — Z124 Encounter for screening for malignant neoplasm of cervix: Secondary | ICD-10-CM | POA: Diagnosis present

## 2023-03-31 DIAGNOSIS — I10 Essential (primary) hypertension: Secondary | ICD-10-CM

## 2023-03-31 DIAGNOSIS — N951 Menopausal and female climacteric states: Secondary | ICD-10-CM

## 2023-03-31 DIAGNOSIS — F439 Reaction to severe stress, unspecified: Secondary | ICD-10-CM

## 2023-03-31 DIAGNOSIS — Z1339 Encounter for screening examination for other mental health and behavioral disorders: Secondary | ICD-10-CM | POA: Diagnosis not present

## 2023-03-31 DIAGNOSIS — Z8659 Personal history of other mental and behavioral disorders: Secondary | ICD-10-CM | POA: Insufficient documentation

## 2023-03-31 DIAGNOSIS — F3112 Bipolar disorder, current episode manic without psychotic features, moderate: Secondary | ICD-10-CM

## 2023-03-31 DIAGNOSIS — Z6839 Body mass index (BMI) 39.0-39.9, adult: Secondary | ICD-10-CM

## 2023-03-31 NOTE — Progress Notes (Signed)
NGYN presents to establish care. Last PAP unknown  Last mammogram 03/2022 Declines STD testing  Pt scored high on PHQ9 and GAD7

## 2023-03-31 NOTE — Progress Notes (Signed)
Subjective:     Stacy Cooper is a 52 y.o. female here at [facility] for a routine exam.  Current complaints: no menses for 10+ months, then menses last month, normal length and amount.  Pt with hx PTSD, bipolar, not currently taking medications or seeing a counselor. She reports the medicine she tried last made her feel "like a zombie". Has PCP and is taking HTN and other medications as prescribed.  Personal health questionnaire reviewed: yes.  Do you have a primary care provider? yes Do you feel safe at home? yes  [employer] Visit from 03/31/2023 in [facility] for [facility] at [facility] Total Score 5      Flowsheet Row Office Visit from 03/31/2023 in [facility] for [facility] at [facility] Total Score 20       Health Maintenance Due  Topic Date Due   COVID-19 Vaccine (1) Never done   HIV Screening  Never done   Hepatitis C Screening  Never done   DTaP/Tdap/Td (1 - Tdap) Never done   Zoster Vaccines- Shingrix (1 of 2) Never done   PAP SMEAR-Modifier  Never done   COLONOSCOPY (Pts 45-19yrs Insurance coverage will need to be confirmed)  Never done     Risk factors for chronic health problems: Smoking: cigarette smoker Alchohol/how much: none Pt BMI: Body mass index is 39.07 kg/m.   Gynecologic History No LMP recorded. (Menstrual status: Perimenopausal). Contraception: tubal ligation Last Pap: unknown, 5+ yrs ago per pt. Results were: normal per pt Last mammogram: 03/11/2022. Results were: normal  Obstetric History OB History  Gravida Para Term Preterm AB Living  2 2 2         SAB IAB Ectopic Multiple Live Births               # Outcome Date GA Lbr Len/2nd Weight Sex Delivery Anes PTL Lv  2 Term      CS-Unspec     1 Term      Vag-Spont        The following portions of the patient's history were reviewed and updated as appropriate: allergies, current medications, past family history, past medical history, past  social history, past surgical history, and problem list.  Review of Systems Pertinent items noted in HPI and remainder of comprehensive ROS otherwise negative.    Objective:  BP (!) 147/91   Pulse 74   Ht 5\' 5"  (1.651 m)   Wt 234 lb 12.8 oz (106.5 kg)   BMI 39.07 kg/m   VS reviewed, nursing note reviewed,  Constitutional: well developed, well nourished, no distress HEENT: normocephalic, thyroid without enlargement or mass HEART: RRR, no murmurs rubs/gallops RESP: clear and equal to auscultation bilaterally in all lobes  Breast Exam:  exam performed: right breast normal without mass, skin or nipple changes or axillary nodes, left breast normal without mass, skin or nipple changes or axillary nodes Abdomen: soft Neuro: alert and oriented x 3 Skin: warm, dry Psych: affect normal Pelvic exam:  Performed: Cervix pink, visually closed, without lesion, scant white creamy discharge, vaginal walls and external genitalia normal Bimanual exam: Cervix 0/long/high, firm, anterior, neg CMT, uterus nontender, nonenlarged, adnexa without tenderness, enlargement, or mass        Assessment/Plan:   1. Encounter for annual routine gynecological examination --Mammogram up to date, scheduled this month --Pap today --Colonoscopy 1 month ago, wnl  2. Class 2 severe obesity with serious comorbidity and body mass index (  BMI) of 39.0 to 39.9 in adult, unspecified obesity type (HCC) --Pt reports that her diet is poor and she is unable to walk/exercise due to back/neck pain. She is following up with orthopedics about surgery. - Referral to Nutrition and Diabetes Services  3. Primary hypertension --BP mildly elevated today, taking medications as prescribed --Reports poor diet, discussed exercise and diet with IBH provider today --Reviewed healthy diet and exercise, encouraged pt to find things that work for her  - Referral to Nutrition and Diabetes Services  4. Screening mammogram for breast  cancer  - MM 3D DIAGNOSTIC MAMMOGRAM BILATERAL BREAST; Future  5. Screening for cervical cancer  - Cytology - PAP( [facility])  6. Positive screening for depression on 9-item Patient Health Questionnaire (PHQ-9) --Pt denies any thoughts of harming herself or others. - Ambulatory referral to Integrated Behavioral Health   7. Bipolar 1 disorder with moderate mania (HCC) --Pt does report episodes of depression alternating with mania (anger/rage per pt) --Pt tried a medication that made her feel "like a zombie" and not better at all.  --Discussed management of bipolar, pt may need to try different medications.  --Pt to see IBH for follow up and may want to change to a new behavioral health office. She used to go to [facility] but it is a busy office, crowded, and this makes her uncomfortable so she does not want to return.  8. History of posttraumatic stress disorder (PTSD)   9. Perimenopause --Hot flashes "for years" but pt well managed with conservative measures, does not desire medication --Still having menses, but infrequent --Some natural remedies shared with pt to try --F/U annually, discuss if symptoms are bothersome and she desires medical management    No follow-ups on file.   Sharen Counter, CNM 1:22 PM

## 2023-04-01 ENCOUNTER — Other Ambulatory Visit: Payer: Self-pay | Admitting: Family Medicine

## 2023-04-01 DIAGNOSIS — M4692 Unspecified inflammatory spondylopathy, cervical region: Secondary | ICD-10-CM

## 2023-04-02 LAB — CYTOLOGY - PAP
Adequacy: ABSENT
Comment: NEGATIVE
Diagnosis: NEGATIVE
High risk HPV: NEGATIVE

## 2023-04-02 NOTE — BH Specialist Note (Signed)
Integrated Behavioral Health Initial In-Person Visit  MRN: [id number] Name: Stacy Cooper  Number of Integrated Behavioral Health Clinician visits: 1 Session Start time:   1015am Session End time: 1030am Total time in minutes: 15 mins consult due to family planning insurance  Types of Service: General Behavioral Integrated Care (BHI)  Interpretor:No. Interpretor Name and Language: none   Warm Hand Off Completed.        Subjective: Stacy Cooper is a 52 y.o. female accompanied by Spouse Patient was referred by L. Leftwich Kirby for depressed mood and elevated phq9. Patient reports the following symptoms/concerns: depressed mood  Duration of problem: over one year; Severity of problem: mild  Objective: Mood: Depressed and Affect: Appropriate Risk of harm to self or others: No plan to harm self or others  Life Context: Family and Social: lives in [place]  School/Work: n/a Self-Care: none Life Changes: n/a  Patient and/or Family's Strengths/Protective Factors: Concrete supports in place (healthy food, safe environments, etc.)  Goals Addressed: Patient will: Reduce symptoms of: depression and stress Increase knowledge and/or ability of: healthy habits  Demonstrate ability to: n/a  Progress towards Goals: Ongoing  Interventions: Interventions utilized: Supportive Counseling  Standardized Assessments completed: PHQ 9  Patient and/or Family Response: Stacy Cooper is present for annual visit and establishing care accompanied by spouse in Room 8. Stacy Cooper reports increase stress and depressed mood most day however Stacy Cooper did not specific source of stress. Stacy Cooper did not make eye contact during consult and did not questions.   Assessment: Patient currently experiencing situational stress.   Patient may benefit from integrated behavioral health.  Plan: Follow up with behavioral health clinician on : 2 weeks   Behavioral recommendations: Attend scheduled virtual visit  Referral(s): Integrated [employer] (In Clinic) "From scale of 1-10, how likely are you to follow plan?":    [name], LCSW

## 2023-04-09 ENCOUNTER — Ambulatory Visit: Payer: 59 | Admitting: Licensed Clinical Social Worker

## 2023-04-09 NOTE — BH Specialist Note (Signed)
Called pt to begin mychart visit. Pt reports she is sleeping and she will call back

## 2023-04-10 ENCOUNTER — Encounter: Payer: Self-pay | Admitting: Family Medicine

## 2023-04-15 ENCOUNTER — Ambulatory Visit (INDEPENDENT_AMBULATORY_CARE_PROVIDER_SITE_OTHER): Payer: 59 | Admitting: Licensed Clinical Social Worker

## 2023-04-15 DIAGNOSIS — F330 Major depressive disorder, recurrent, mild: Secondary | ICD-10-CM | POA: Diagnosis not present

## 2023-04-17 ENCOUNTER — Ambulatory Visit
Admission: RE | Admit: 2023-04-17 | Discharge: 2023-04-17 | Disposition: A | Discharge status: Home / Self Care | Payer: Managed Care, Other (non HMO) | Source: Ambulatory Visit | Attending: Advanced Practice Midwife | Admitting: Advanced Practice Midwife

## 2023-04-17 DIAGNOSIS — Z1231 Encounter for screening mammogram for malignant neoplasm of breast: Secondary | ICD-10-CM

## 2023-04-17 NOTE — BH Specialist Note (Signed)
Integrated Behavioral Health via Telemedicine Visit  04/17/2023 [name] [id number]  Number of Integrated Behavioral Health Clinician visits: 2 Session Start time:  1:30pm Session End time: 1:48pm Total time in minutes: 18 mins via mychart video  Referring Provider: L. Leftwich-Kirby Patient/Family location: Home  [facility] Provider location: [facility] All persons participating in visit: Pt A Stacy Cooper and LCSW A. Xochil Shanker  Types of Service: Individual psychotherapy and Video visit  I connected with [name] and/or [name]'s n/a via  Telephone or Video Enabled Telemedicine Application  (Video is Caregility application) and verified that I am speaking with the correct person using two identifiers. Discussed confidentiality: Yes   I discussed the limitations of telemedicine and the availability of in person appointments.  Discussed there is a possibility of technology failure and discussed alternative modes of communication if that failure occurs.  I discussed that engaging in this telemedicine visit, they consent to the provision of behavioral healthcare and the services will be billed under their insurance.  Patient and/or legal guardian expressed understanding and consented to Telemedicine visit: Yes   Presenting Concerns: Patient and/or family reports the following symptoms/concerns: Major Depressive Disorder Duration of problem: over one year; Severity of problem: mild  Patient and/or Family's Strengths/Protective Factors: Concrete supports in place (healthy food, safe environments, etc.)  Goals Addressed: Patient will:  Reduce symptoms of: depression   Increase knowledge and/or ability of: coping skills   Demonstrate ability to: Increase healthy adjustment to current life circumstances  Progress towards Goals: Ongoing  Interventions: Interventions utilized:  CBT Cognitive Behavioral Therapy and Supportive Counseling Standardized  Assessments completed: PHQ 9  Patient and/or Family Response: Pt reports depressed mood, trouble sleeping, negative thought patterns, social isolation and loss of interest. [name] reports conflict with stepdaughter has increased stress. [name] reports depressed mood most days. According to Ms. Stacy Cooper, reports her home is in danger of foreclosure.   Assessment: Patient currently experiencing major depressive disorder.   Patient may benefit from integrated behavioral health.  Plan: Follow up with behavioral health clinician on : 04/29/2023 Behavioral recommendations: Journal writing, research resources to assist with foreclosure prevention, communicate needs with spouse and family therapy Referral(s): Integrated [employer] (In Clinic)  I discussed the assessment and treatment plan with the patient and/or parent/guardian. They were provided an opportunity to ask questions and all were answered. They agreed with the plan and demonstrated an understanding of the instructions.   They were advised to call back or seek an in-person evaluation if the symptoms worsen or if the condition fails to improve as anticipated.  [name], LCSW

## 2023-04-20 ENCOUNTER — Ambulatory Visit
Admission: RE | Admit: 2023-04-20 | Discharge: 2023-04-20 | Disposition: A | Discharge status: Home / Self Care | Payer: Managed Care, Other (non HMO) | Source: Ambulatory Visit | Attending: Family Medicine | Admitting: Family Medicine

## 2023-04-20 DIAGNOSIS — M4692 Unspecified inflammatory spondylopathy, cervical region: Secondary | ICD-10-CM

## 2023-04-29 ENCOUNTER — Ambulatory Visit (INDEPENDENT_AMBULATORY_CARE_PROVIDER_SITE_OTHER): Payer: Managed Care, Other (non HMO) | Admitting: Licensed Clinical Social Worker

## 2023-04-29 DIAGNOSIS — F330 Major depressive disorder, recurrent, mild: Secondary | ICD-10-CM | POA: Diagnosis not present

## 2023-05-05 NOTE — BH Specialist Note (Signed)
Integrated Behavioral Health via Telemedicine Visit  05/05/2023 [name] [id number]  Number of Integrated Behavioral Health Clinician visits: 3 Session Start time:  1:30pm Session End time: 2:00pm Total time in minutes: 30 mins via mychart video   Referring Provider: : L. Leftwich-Kirby CNM Patient/Family location: Home  [facility] Provider location: [facility]  All persons participating in visit: Pt A Cooper and LCSW A Fabio Wah  Types of Service: Individual psychotherapy and Video visit  I connected with Stacy Cooper and/or Stacy Cooper's n/a via  Telephone or Video Enabled Telemedicine Application  (Video is Caregility application) and verified that I am speaking with the correct person using two identifiers. Discussed confidentiality: Yes   I discussed the limitations of telemedicine and the availability of in person appointments.  Discussed there is a possibility of technology failure and discussed alternative modes of communication if that failure occurs.  I discussed that engaging in this telemedicine visit, they consent to the provision of behavioral healthcare and the services will be billed under their insurance.  Patient and/or legal guardian expressed understanding and consented to Telemedicine visit: Yes   Presenting Concerns: Patient and/or family reports the following symptoms/concerns: depressed mood, situational stress, fatigue, negative thought patterns  Duration of problem: approx one year; Severity of problem: mild  Patient and/or Family's Strengths/Protective Factors: Concrete supports in place (healthy food, safe environments, etc.)  Goals Addressed: Patient will:  Reduce symptoms of: depression   Increase knowledge and/or ability of: healthy habits and coping skills   Demonstrate ability to: Increase healthy adjustment to current life circumstances  Progress towards Goals: Ongoing  Interventions: Interventions utilized:   Supportive Counseling Standardized Assessments completed: PHQ 9  Patient and/or Family Response: Ms. Marone reports depressed mood and at risk for foreclosure of home. Ms. Bonfiglio reports she has limited family and financial support which is causing physical and mental stress.   Assessment: Patient currently experiencing depressive disorder .   Patient may benefit from integrated behavioral health.  Plan: Follow up with behavioral health clinician on : 05/13/2023 Behavioral recommendations: contact community resources given for foreclosure prevention,  Referral(s): Integrated [employer] (In Clinic)  I discussed the assessment and treatment plan with the patient and/or parent/guardian. They were provided an opportunity to ask questions and all were answered. They agreed with the plan and demonstrated an understanding of the instructions.   They were advised to call back or seek an in-person evaluation if the symptoms worsen or if the condition fails to improve as anticipated.  [name], LCSW

## 2023-05-13 ENCOUNTER — Ambulatory Visit (INDEPENDENT_AMBULATORY_CARE_PROVIDER_SITE_OTHER): Payer: 59 | Admitting: Licensed Clinical Social Worker

## 2023-05-13 DIAGNOSIS — F330 Major depressive disorder, recurrent, mild: Secondary | ICD-10-CM | POA: Diagnosis not present

## 2023-05-19 ENCOUNTER — Telehealth: Payer: Self-pay

## 2023-05-19 ENCOUNTER — Other Ambulatory Visit: Payer: Self-pay | Admitting: Orthopedic Surgery

## 2023-05-19 NOTE — Telephone Encounter (Signed)
   Pre-operative Risk Assessment    Patient Name: Stacy Cooper  DOB: 05-23-1971 MRN: [id number]      Request for Surgical Clearance    Procedure:   ANTERIOR CERVICAL DECOMPRESSION/FUSION CERVICAL 4-5, CERVICAL 5-6, CERVICAL 6-7   Date of Surgery:  Clearance 06/05/23                                 Surgeon:  DR. MARK DUMONSKI Surgeon's Group or Practice Name:  [facility] Phone number:  [phone] Fax number:  [phone] ATTN: DONNA MCCAIN   Type of Clearance Requested:   - Medical    Type of Anesthesia:  Not Indicated   Additional requests/questions:    SignedMichaelle Copas   05/19/2023, 2:34 PM

## 2023-05-19 NOTE — Telephone Encounter (Signed)
Dr. Servando Salina,  You saw this patient on 03/21/2023. Will you please comment on medical clearance for anterior cervical decompression/fusion cervical 4-5, cervical 5-6, cervical 6-7?  Please route your response to P CV DIV Preop. I will communicate with requesting office once you have given recommendations.   Thank you!  Carlos Levering, NP

## 2023-05-19 NOTE — BH Specialist Note (Signed)
Integrated Behavioral Health via Telemedicine Visit  05/19/2023 Stacy Cooper [id number]  Number of Integrated Behavioral Health Clinician visits: 4 Session Start time:  1:00pm Session End time: 1:26pm Total time in minutes: 26 mins via mychart video   Referring Provider: L. Leftwich Craige Cotta CNM Patient/Family location: Home  [facility] Provider location: [facility]  All persons participating in visit: Stacy Cooper and LCSW A Marleen Moret  Types of Service: Individual psychotherapy and Video visit  I connected with Stacy Cooper and/or Stacy Cooper's n/a via  Telephone or Video Enabled Telemedicine Application  (Video is Caregility application) and verified that I am speaking with the correct person using two identifiers. Discussed confidentiality: Yes   I discussed the limitations of telemedicine and the availability of in person appointments.  Discussed there is a possibility of technology failure and discussed alternative modes of communication if that failure occurs.  I discussed that engaging in this telemedicine visit, they consent to the provision of behavioral healthcare and the services will be billed under their insurance.  Patient and/or legal guardian expressed understanding and consented to Telemedicine visit: Yes   Presenting Concerns: Patient and/or family reports the following symptoms/concerns: depressive disorder  Duration of problem: n/a; Severity of problem: mild  Patient and/or Family's Strengths/Protective Factors: Concrete supports in place (healthy food, safe environments, etc.)  Goals Addressed: Patient will:  Reduce symptoms of: depression   Increase knowledge and/or ability of: coping skills   Demonstrate ability to: Increase healthy adjustment to current life circumstances  Progress towards Goals: Ongoing  Interventions: Interventions utilized:  Supportive Counseling Standardized Assessments completed: PHQ 9  Patient and/or  Family Response:Stacy Cooper reports depressive symptoms.  Assessment: Patient currently experiencing depressive disorder.   Patient may benefit from integrated behavioral health .  Plan: Follow up with behavioral health clinician on : 05/27/2023 Behavioral recommendations:  Light physical activity, rest, communicate need for support and quality time with spouse  Referral(s): Integrated [employer] (In Clinic)  I discussed the assessment and treatment plan with the patient and/or parent/guardian. They were provided an opportunity to ask questions and all were answered. They agreed with the plan and demonstrated an understanding of the instructions.   They were advised to call back or seek an in-person evaluation if the symptoms worsen or if the condition fails to improve as anticipated.  [name], LCSW

## 2023-05-27 ENCOUNTER — Ambulatory Visit (INDEPENDENT_AMBULATORY_CARE_PROVIDER_SITE_OTHER): Payer: Managed Care, Other (non HMO) | Admitting: Licensed Clinical Social Worker

## 2023-05-27 DIAGNOSIS — F330 Major depressive disorder, recurrent, mild: Secondary | ICD-10-CM | POA: Diagnosis not present

## 2023-05-28 ENCOUNTER — Telehealth: Payer: Self-pay

## 2023-05-28 NOTE — Telephone Encounter (Signed)
   Name: Stacy Cooper  DOB: December 26, 1970  MRN: [id number]  Primary Cardiologist: Thomasene Ripple, DO   Preoperative team, please contact this patient and set up a phone call appointment for further preoperative risk assessment. Please obtain consent and complete medication review. Thank you for your help.  I confirm that guidance regarding antiplatelet and oral anticoagulation therapy has been completed and, if necessary, noted below.  None requested    Ronney Asters, NP 05/28/2023, 9:30 AM [facility]

## 2023-05-28 NOTE — Telephone Encounter (Signed)
Pt agrees and  scheduled on 7/22 @2 :40 for Tele visit

## 2023-05-28 NOTE — Telephone Encounter (Signed)
Pt agreed to tele appt 7/22 @2 :40 pm. Med req and consent done.

## 2023-05-29 NOTE — BH Specialist Note (Signed)
Integrated Behavioral Health via Telemedicine Visit  05/29/2023 Stacy Cooper [id number]  Number of Integrated Behavioral Health Clinician visits: 1 Session Start time: 328pm Session End time: 346pm Total time in minutes: 18 mins via mychart video  Referring Provider: Bonna Gains CNM Patient/Family location: Home  [facility] Provider location: [facility]  All persons participating in visit: Stacy Cooper and LCSW A Enio Hornback  Types of Service: Individual psychotherapy and Video visit  I connected with Stacy Cooper and/or Stacy Cooper's n/a via  Telephone or Video Enabled Telemedicine Application  (Video is Caregility application) and verified that I am speaking with the correct person using two identifiers. Discussed confidentiality: Yes   I discussed the limitations of telemedicine and the availability of in person appointments.  Discussed there is a possibility of technology failure and discussed alternative modes of communication if that failure occurs.  I discussed that engaging in this telemedicine visit, they consent to the provision of behavioral healthcare and the services will be billed under their insurance.  Patient and/or legal guardian expressed understanding and consented to Telemedicine visit: Yes   Presenting Concerns: Patient and/or family reports the following symptoms/concerns: depressive disorder Duration of problem: over one year ; Severity of problem: mild  Patient and/or Family's Strengths/Protective Factors: Concrete supports in place (healthy food, safe environments, etc.)  Goals Addressed: Patient will:  Reduce symptoms of: depression   Increase knowledge and/or ability of: coping skills   Demonstrate ability to: Increase healthy adjustment to current life circumstances  Progress towards Goals: Ongoing  Interventions: Interventions utilized:  Supportive Counseling Standardized Assessments completed: PHQ 9  Patient  and/or Family Response: Stacy Cooper reports no changes in mood. Stacy Cooper reports feeling nervous regarding upcoming neck surgery.   Assessment: Patient currently experiencing depressive disorder.   Patient may benefit from integrated behavioral health.  Plan: Follow up with behavioral health clinician on : 2-3 weeks via mychart  Behavioral recommendations: Collaborate with medical providers to address anxious emotions centered around surgery.  Referral(s): Integrated [employer] (In Clinic)  I discussed the assessment and treatment plan with the patient and/or parent/guardian. They were provided an opportunity to ask questions and all were answered. They agreed with the plan and demonstrated an understanding of the instructions.   They were advised to call back or seek an in-person evaluation if the symptoms worsen or if the condition fails to improve as anticipated.  Stacy Saxon, LCSW

## 2023-06-02 ENCOUNTER — Ambulatory Visit: Payer: Managed Care, Other (non HMO) | Attending: Cardiology | Admitting: Adult Health

## 2023-06-02 NOTE — Progress Notes (Signed)
Virtual Visit via Telephone Note   Because of Stacy Cooper's co-morbid illnesses, she is at least at moderate risk for complications without adequate follow up.  This format is felt to be most appropriate for this patient at this time.  The patient did not have access to video technology/had technical difficulties with video requiring transitioning to audio format only (telephone).  All issues noted in this document were discussed and addressed.  No physical exam could be performed with this format.  Please refer to the patient's chart for her consent to telehealth for [facility].  Evaluation Performed:  Preoperative cardiovascular risk assessment _____________   Date:  06/02/2023   Patient ID:  Stacy Cooper, DOB June 22, 1971, MRN [id number] Patient Location:  Home Provider location:   Office  Primary Care Provider:  Norm Salt, PA Primary Cardiologist:  Thomasene Ripple, DO  Chief Complaint / Patient Profile   52 y.o. y/o female with a h/o primary hypertension, ongoing tobacco abuse, and morbid obesity who is pending anterior cervical decompression/fusion of cervical 4- 5, cervical 5 - 6, and cervical 6- 7 scheduled for 06/05/2023 by Dr Yevette Edwards with [facility]. Presents today for telephonic preoperative cardiovascular risk assessment.  History of Present Illness    Stacy Cooper is a 52 y.o. female who presents via audio/video conferencing for a telehealth visit today.  Pt was last seen in cardiology clinic on 03/21/2023 by Dr.Tobb.  Never at that time [facility] was doing well given instruction on smoking cessation and need for weight loss and exercise.  The patient is now pending procedure as outlined above. Since her last visit, she has been doing well with the exception of her back and neck pain.  She denies any chest pain, DOE, or dizziness.   Past Medical History    Past Medical History:  Diagnosis Date   Anemia     Diabetes (HCC)    Past Surgical History:  Procedure Laterality Date   CESAREAN SECTION     TUBAL LIGATION      Allergies  Allergies  Allergen Reactions   Codeine Nausea And Vomiting   Shellfish Allergy Itching and Swelling   Prednisone Rash    Causes H/A    Home Medications    Prior to Admission medications   Medication Sig Start Date End Date Taking? Authorizing Provider  acetaminophen (TYLENOL) 650 MG CR tablet Tylenol Arthritis Extended Release    [provider]  amLODipine (NORVASC) 2.5 MG tablet Take 1 tablet (2.5 mg total) by mouth daily. Patient not taking: Reported on 05/28/2023 11/29/22   Tobb, Kardie, DO  gabapentin (NEURONTIN) 100 MG capsule Take 100 mg by mouth 3 (three) times daily.    [provider]  lidocaine (HM LIDOCAINE PATCH) 4 % Place 1 patch onto the skin daily.    [provider]  metoprolol tartrate (LOPRESSOR) 100 MG tablet Take 2 hours prior to CT Patient not taking: Reported on 03/31/2023 11/29/22   Tobb, Kardie, DO  rosuvastatin (CRESTOR) 5 MG tablet Take 1 tablet (5 mg total) by mouth daily. 12/05/22   Thomasene Ripple, DO    Physical Exam    Vital Signs:  Stacy Cooper does not have vital signs available for review today.  Given telephonic nature of communication, physical exam is limited. AAOx3. NAD. Normal affect.  Speech and respirations are unlabored.  Accessory Clinical Findings    None  Assessment & Plan    1.  Preoperative Cardiovascular Risk Assessment:  The patient was  advised that if she develops new symptoms prior to surgery to contact our office to arrange for a follow-up visit, and she verbalized understanding.  According to the Revised Cardiac Risk Index (RCRI), her Perioperative Risk of Major Cardiac Event is (%): 0.4  Her Functional Capacity in METs is: 7.25 according to the [facility] Activity Status Index (DASI).   Therefore, based on ACC/AHA guidelines, patient would be at acceptable risk for  the planned procedure without further cardiovascular testing.   A copy of this note will be routed to requesting surgeon.  Time:   Today, I have spent 10 minutes with the patient with telehealth technology discussing medical history, symptoms, and management plan.     Joni Reining, NP  06/02/2023, 2:45 PM

## 2023-06-03 ENCOUNTER — Encounter (HOSPITAL_COMMUNITY): Payer: Self-pay | Admitting: Orthopedic Surgery

## 2023-06-03 NOTE — Pre-Procedure Instructions (Signed)
Surgical Instructions   Your procedure is scheduled on June 19, 2023. Report to [facility] Main Entrance "A" at 11:00 A.M., then check in with the Admitting office. Any questions or running late day of surgery: call [phone]  Questions prior to your surgery date: call [phone], Monday-Friday, 8am-4pm. If you experience any cold or flu symptoms such as cough, fever, chills, shortness of breath, etc. between now and your scheduled surgery, please notify us at the above number.     Remember:  Do not eat after midnight the night before your surgery  You may drink clear liquids until 11:00 AM the morning of your surgery.   Clear liquids allowed are: Water, Non-Citrus Juices (without pulp), Carbonated Beverages, Clear Tea, Black Coffee Only (NO MILK, CREAM OR POWDERED CREAMER of any kind), and Gatorade.  Patient Instructions  The night before surgery:  No food after midnight. ONLY clear liquids after midnight  The day of surgery (if you do NOT have diabetes):  Drink ONE (1) Pre-Surgery Clear Ensure by 11:00 AM the morning of surgery. Drink in one sitting. Do not sip.  This drink was given to you during your hospital  pre-op appointment visit.  Nothing else to drink after completing the  Pre-Surgery Clear Ensure.         If you have questions, please contact your surgeon's office.     Take these medicines the morning of surgery with A SIP OF WATER: amLODipine (NORVASC)  rosuvastatin (CRESTOR)    May take these medicines IF NEEDED: acetaminophen (TYLENOL)     One week prior to surgery, STOP taking any Aspirin (unless otherwise instructed by your surgeon) Aleve, Naproxen, Ibuprofen, Motrin, Advil, Goody's, BC's, all herbal medications, fish oil, and non-prescription vitamins.                     Do NOT Smoke (Tobacco/Vaping) for 24 hours prior to your procedure.  If you use a CPAP at night, you may bring your mask/headgear for your overnight stay.   You will be  asked to remove any contacts, glasses, piercing's, hearing aid's, dentures/partials prior to surgery. Please bring cases for these items if needed.    Patients discharged the day of surgery will not be allowed to drive home, and someone needs to stay with them for 24 hours.  SURGICAL WAITING ROOM VISITATION Patients may have no more than 2 support people in the waiting area - these visitors may rotate.   Pre-op nurse will coordinate an appropriate time for 1 ADULT support person, who may not rotate, to accompany patient in pre-op.  Children under the age of 19 must have an adult with them who is not the patient and must remain in the main waiting area with an adult.  If the patient needs to stay at the hospital during part of their recovery, the visitor guidelines for inpatient rooms apply.  Please refer to the [facility] website for the visitor guidelines for any additional information.   If you received a COVID test during your pre-op visit  it is requested that you wear a mask when out in public, stay away from anyone that may not be feeling well and notify your surgeon if you develop symptoms. If you have been in contact with anyone that has tested positive in the last 10 days please notify you surgeon.      Pre-operative 5 CHG Bathing Instructions   You can play a key role in reducing the risk of infection  after surgery. Your skin needs to be as free of germs as possible. You can reduce the number of germs on your skin by washing with CHG (chlorhexidine gluconate) soap before surgery. CHG is an antiseptic soap that kills germs and continues to kill germs even after washing.   DO NOT use if you have an allergy to chlorhexidine/CHG or antibacterial soaps. If your skin becomes reddened or irritated, stop using the CHG and notify one of our RNs at [phone].   Please shower with the CHG soap starting 4 days before surgery using the following schedule:     Please keep in mind the  following:  DO NOT shave, including legs and underarms, starting the day of your first shower.   You may shave your face at any point before/day of surgery.  Place clean sheets on your bed the day you start using CHG soap. Use a clean washcloth (not used since being washed) for each shower. DO NOT sleep with pets once you start using the CHG.   CHG Shower Instructions:  If you choose to wash your hair and private area, wash first with your normal shampoo/soap.  After you use shampoo/soap, rinse your hair and body thoroughly to remove shampoo/soap residue.  Turn the water OFF and apply about 3 tablespoons (45 ml) of CHG soap to a CLEAN washcloth.  Apply CHG soap ONLY FROM YOUR NECK DOWN TO YOUR TOES (washing for 3-5 minutes)  DO NOT use CHG soap on face, private areas, open wounds, or sores.  Pay special attention to the area where your surgery is being performed.  If you are having back surgery, having someone wash your back for you may be helpful. Wait 2 minutes after CHG soap is applied, then you may rinse off the CHG soap.  Pat dry with a clean towel  Put on clean clothes/pajamas   If you choose to wear lotion, please use ONLY the CHG-compatible lotions on the back of this paper.   Additional instructions for the day of surgery: DO NOT APPLY any lotions, deodorants, cologne, or perfumes.   Do not bring valuables to the hospital. [facility] is not responsible for any belongings/valuables. Do not wear nail polish, gel polish, artificial nails, or any other type of covering on natural nails (fingers and toes) Do not wear jewelry or makeup Put on clean/comfortable clothes.  Please brush your teeth.  Ask your nurse before applying any prescription medications to the skin.     CHG Compatible Lotions   Aveeno Moisturizing lotion  Cetaphil Moisturizing Cream  Cetaphil Moisturizing Lotion  Clairol Herbal Essence Moisturizing Lotion, Dry Skin  Clairol Herbal Essence Moisturizing  Lotion, Extra Dry Skin  Clairol Herbal Essence Moisturizing Lotion, Normal Skin  Curel Age Defying Therapeutic Moisturizing Lotion with Alpha Hydroxy  Curel Extreme Care Body Lotion  Curel Soothing Hands Moisturizing Hand Lotion  Curel Therapeutic Moisturizing Cream, Fragrance-Free  Curel Therapeutic Moisturizing Lotion, Fragrance-Free  Curel Therapeutic Moisturizing Lotion, Original Formula  Eucerin Daily Replenishing Lotion  Eucerin Dry Skin Therapy Plus Alpha Hydroxy Crme  Eucerin Dry Skin Therapy Plus Alpha Hydroxy Lotion  Eucerin Original Crme  Eucerin Original Lotion  Eucerin Plus Crme Eucerin Plus Lotion  Eucerin TriLipid Replenishing Lotion  Keri Anti-Bacterial Hand Lotion  Keri Deep Conditioning Original Lotion Dry Skin Formula Softly Scented  Keri Deep Conditioning Original Lotion, Fragrance Free Sensitive Skin Formula  Keri Lotion Fast Absorbing Fragrance Free Sensitive Skin Formula  Keri Lotion Fast Absorbing Softly Scented Dry Skin Formula  Keri Original Lotion  [employer] Skin Renewal Lotion Keri Silky Smooth Lotion  Keri Silky Smooth Sensitive Skin Lotion  Nivea Body Creamy Conditioning Patent examiner Moisturizing Lotion Nivea Crme  Nivea Skin Firming Lotion  NutraDerm 30 Skin Lotion  NutraDerm Skin Lotion  NutraDerm Therapeutic Skin Cream  NutraDerm Therapeutic Skin Lotion  ProShield Protective Hand Cream  Provon moisturizing lotion  Please read over the following fact sheets that you were given.

## 2023-06-04 ENCOUNTER — Encounter (HOSPITAL_COMMUNITY): Payer: Self-pay

## 2023-06-04 ENCOUNTER — Encounter (HOSPITAL_COMMUNITY)
Admission: RE | Admit: 2023-06-04 | Discharge: 2023-06-04 | Disposition: A | Discharge status: Home / Self Care | Payer: Managed Care, Other (non HMO) | Source: Ambulatory Visit | Attending: Orthopedic Surgery | Admitting: Orthopedic Surgery

## 2023-06-04 ENCOUNTER — Other Ambulatory Visit: Payer: Self-pay

## 2023-06-04 VITALS — BP 120/68 | HR 87 | Temp 98.4°F | Resp 18 | Ht 66.0 in | Wt 240.2 lb

## 2023-06-04 DIAGNOSIS — Z01818 Encounter for other preprocedural examination: Secondary | ICD-10-CM

## 2023-06-04 DIAGNOSIS — Z01812 Encounter for preprocedural laboratory examination: Secondary | ICD-10-CM | POA: Diagnosis not present

## 2023-06-04 HISTORY — DX: Depression, unspecified: F32.A

## 2023-06-04 HISTORY — DX: Anxiety disorder, unspecified: F41.9

## 2023-06-04 LAB — ABO/RH: ABO/RH(D): O POS

## 2023-06-04 LAB — CBC
HCT: 40.3 % (ref 36.0–46.0)
Hemoglobin: 12.9 g/dL (ref 12.0–15.0)
MCH: 29.1 pg (ref 26.0–34.0)
MCHC: 32 g/dL (ref 30.0–36.0)
MCV: 90.8 fL (ref 80.0–100.0)
Platelets: 363 10*3/uL (ref 150–400)
RBC: 4.44 MIL/uL (ref 3.87–5.11)
RDW: 15.9 % — ABNORMAL HIGH (ref 11.5–15.5)
WBC: 7.4 10*3/uL (ref 4.0–10.5)
nRBC: 0 % (ref 0.0–0.2)

## 2023-06-04 LAB — BASIC METABOLIC PANEL
Anion gap: 5 (ref 5–15)
BUN: 14 mg/dL (ref 6–20)
CO2: 22 mmol/L (ref 22–32)
Calcium: 9.1 mg/dL (ref 8.9–10.3)
Chloride: 111 mmol/L (ref 98–111)
Creatinine, Ser: 0.64 mg/dL (ref 0.44–1.00)
GFR, Estimated: >60 mL/min (ref >60)
Glucose, Bld: 137 mg/dL — ABNORMAL HIGH (ref 70–99)
Potassium: 3.9 mmol/L (ref 3.5–5.1)
Sodium: 138 mmol/L (ref 135–145)

## 2023-06-04 LAB — SURGICAL PCR SCREEN
MRSA, PCR: NEGATIVE
Staphylococcus aureus: NEGATIVE

## 2023-06-04 LAB — GLUCOSE, CAPILLARY: Glucose-Capillary: 140 mg/dL — ABNORMAL HIGH (ref 70–99)

## 2023-06-04 NOTE — Pre-Procedure Instructions (Addendum)
Surgical Instructions   Your procedure is scheduled on June 19, 2023. Report to [facility] Main Entrance "A" at 11:00 A.M., then check in with the Admitting office. Any questions or running late day of surgery: call [phone]  Questions prior to your surgery date: call [phone], Monday-Friday, 8am-4pm. If you experience any cold or flu symptoms such as cough, fever, chills, shortness of breath, etc. between now and your scheduled surgery, please notify us at the above number.     Remember:  Do not eat after midnight the night before your surgery  You may drink clear liquids until 11:00 AM the morning of your surgery.   Clear liquids allowed are: Water, Non-Citrus Juices (without pulp), Carbonated Beverages, Clear Tea, Black Coffee Only (NO MILK, CREAM OR POWDERED CREAMER of any kind), and Gatorade.    The day of surgery (if you have diabetes): Drink ONE (1) 12 oz G2 given to you in your pre admission testing appointment by 11:00 the morning of surgery. Drink in one sitting. Do not sip.  This drink was given to you during your hospital  pre-op appointment visit.  Nothing else to drink after completing the  12 oz bottle of G2.         If you have questions, please contact your surgeon's office.    Take these medicines the morning of surgery with A SIP OF WATER: amLODipine (NORVASC) -patient states she normally takes this medication at night and will take the night before surgery. rosuvastatin (CRESTOR) patient states she normally takes this medication at night and will take the night before surgery   May take these medicines IF NEEDED: acetaminophen (TYLENOL)     One week prior to surgery, STOP taking any Aspirin (unless otherwise instructed by your surgeon) Aleve, Naproxen, Ibuprofen, Motrin, Advil, Goody's, BC's, all herbal medications, fish oil, and non-prescription vitamins.           HOW TO MANAGE YOUR DIABETES BEFORE AND AFTER SURGERY  Why is it important to  control my blood sugar before and after surgery? Improving blood sugar levels before and after surgery helps healing and can limit problems. A way of improving blood sugar control is eating a healthy diet by:  Eating less sugar and carbohydrates  Increasing activity/exercise  Talking with your doctor about reaching your blood sugar goals High blood sugars (greater than 180 mg/dL) can raise your risk of infections and slow your recovery, so you will need to focus on controlling your diabetes during the weeks before surgery. Make sure that the doctor who takes care of your diabetes knows about your planned surgery including the date and location.  How do I manage my blood sugar before surgery? Check your blood sugar at least 4 times a day, starting 2 days before surgery, to make sure that the level is not too high or low.  Check your blood sugar the morning of your surgery when you wake up and every 2 hours until you get to the Short Stay unit.  If your blood sugar is less than 70 mg/dL, you will need to treat for low blood sugar: Do not take insulin. Treat a low blood sugar (less than 70 mg/dL) with  cup of clear juice (cranberry or apple), 4 glucose tablets, OR glucose gel. Recheck blood sugar in 15 minutes after treatment (to make sure it is greater than 70 mg/dL). If your blood sugar is not greater than 70 mg/dL on recheck, call [phone] for further instructions. Report your blood sugar  to the short stay nurse when you get to Short Stay.  If you are admitted to the hospital after surgery: Your blood sugar will be checked by the staff and you will probably be given insulin after surgery (instead of oral diabetes medicines) to make sure you have good blood sugar levels. The goal for blood sugar control after surgery is 80-180 mg/dL.            Do NOT Smoke (Tobacco/Vaping) for 24 hours prior to your procedure.  If you use a CPAP at night, you may bring your mask/headgear for your  overnight stay.   You will be asked to remove any contacts, glasses, piercing's, hearing aid's, dentures/partials prior to surgery. Please bring cases for these items if needed.    Patients discharged the day of surgery will not be allowed to drive home, and someone needs to stay with them for 24 hours.  SURGICAL WAITING ROOM VISITATION Patients may have no more than 2 support people in the waiting area - these visitors may rotate.   Pre-op nurse will coordinate an appropriate time for 1 ADULT support person, who may not rotate, to accompany patient in pre-op.  Children under the age of 36 must have an adult with them who is not the patient and must remain in the main waiting area with an adult.  If the patient needs to stay at the hospital during part of their recovery, the visitor guidelines for inpatient rooms apply.  Please refer to the [facility] website for the visitor guidelines for any additional information.   If you received a COVID test during your pre-op visit  it is requested that you wear a mask when out in public, stay away from anyone that may not be feeling well and notify your surgeon if you develop symptoms. If you have been in contact with anyone that has tested positive in the last 10 days please notify you surgeon.      Pre-operative 5 CHG Bathing Instructions   You can play a key role in reducing the risk of infection after surgery. Your skin needs to be as free of germs as possible. You can reduce the number of germs on your skin by washing with CHG (chlorhexidine gluconate) soap before surgery. CHG is an antiseptic soap that kills germs and continues to kill germs even after washing.   DO NOT use if you have an allergy to chlorhexidine/CHG or antibacterial soaps. If your skin becomes reddened or irritated, stop using the CHG and notify one of our RNs at [phone].   Please shower with the CHG soap starting 4 days before surgery using the following schedule:      Please keep in mind the following:  DO NOT shave, including legs and underarms, starting the day of your first shower.   You may shave your face at any point before/day of surgery.  Place clean sheets on your bed the day you start using CHG soap. Use a clean washcloth (not used since being washed) for each shower. DO NOT sleep with pets once you start using the CHG.   CHG Shower Instructions:  If you choose to wash your hair and private area, wash first with your normal shampoo/soap.  After you use shampoo/soap, rinse your hair and body thoroughly to remove shampoo/soap residue.  Turn the water OFF and apply about 3 tablespoons (45 ml) of CHG soap to a CLEAN washcloth.  Apply CHG soap ONLY FROM YOUR NECK DOWN TO YOUR TOES (  washing for 3-5 minutes)  DO NOT use CHG soap on face, private areas, open wounds, or sores.  Pay special attention to the area where your surgery is being performed.  If you are having back surgery, having someone wash your back for you may be helpful. Wait 2 minutes after CHG soap is applied, then you may rinse off the CHG soap.  Pat dry with a clean towel  Put on clean clothes/pajamas   If you choose to wear lotion, please use ONLY the CHG-compatible lotions on the back of this paper.   Additional instructions for the day of surgery: DO NOT APPLY any lotions, deodorants, cologne, or perfumes.   Do not bring valuables to the hospital. [facility] is not responsible for any belongings/valuables. Do not wear nail polish, gel polish, artificial nails, or any other type of covering on natural nails (fingers and toes) Do not wear jewelry or makeup Put on clean/comfortable clothes.  Please brush your teeth.  Ask your nurse before applying any prescription medications to the skin.     CHG Compatible Lotions   Aveeno Moisturizing lotion  Cetaphil Moisturizing Cream  Cetaphil Moisturizing Lotion  Clairol Herbal Essence Moisturizing Lotion, Dry Skin  Clairol  Herbal Essence Moisturizing Lotion, Extra Dry Skin  Clairol Herbal Essence Moisturizing Lotion, Normal Skin  Curel Age Defying Therapeutic Moisturizing Lotion with Alpha Hydroxy  Curel Extreme Care Body Lotion  Curel Soothing Hands Moisturizing Hand Lotion  Curel Therapeutic Moisturizing Cream, Fragrance-Free  Curel Therapeutic Moisturizing Lotion, Fragrance-Free  Curel Therapeutic Moisturizing Lotion, Original Formula  Eucerin Daily Replenishing Lotion  Eucerin Dry Skin Therapy Plus Alpha Hydroxy Crme  Eucerin Dry Skin Therapy Plus Alpha Hydroxy Lotion  Eucerin Original Crme  Eucerin Original Lotion  Eucerin Plus Crme Eucerin Plus Lotion  Eucerin TriLipid Replenishing Lotion  Keri Anti-Bacterial Hand Lotion  Keri Deep Conditioning Original Lotion Dry Skin Formula Softly Scented  Keri Deep Conditioning Original Lotion, Fragrance Free Sensitive Skin Formula  Keri Lotion Fast Absorbing Fragrance Free Sensitive Skin Formula  Keri Lotion Fast Absorbing Softly Scented Dry Skin Formula  Keri Original Lotion  Keri Skin Renewal Lotion Keri Silky Smooth Lotion  Keri Silky Smooth Sensitive Skin Lotion  Nivea Body Creamy Conditioning Oil  Nivea Body Extra Enriched Lotion  Nivea Body Original Lotion  Nivea Body Sheer Moisturizing Lotion Nivea Crme  Nivea Skin Firming Lotion  NutraDerm 30 Skin Lotion  NutraDerm Skin Lotion  NutraDerm Therapeutic Skin Cream  NutraDerm Therapeutic Skin Lotion  ProShield Protective Hand Cream  Provon moisturizing lotion  Please read over the following fact sheets that you were given.

## 2023-06-04 NOTE — Progress Notes (Signed)
PCP - Veronia Beets Cardiologist - Lavona [place]ound Tobb,MD  PPM/ICD - denies Device Orders -  Rep Notified -   Chest x-ray - 11/07/22 EKG - 11/08/22 Stress Test - none ECHO - none Cardiac Cath - none  Sleep Study - none CPAP -   Fasting Blood Sugar - 125-130 Checks Blood Sugar 2-3 times a day  Last dose of GLP1 agonist-  na GLP1 instructions:   Blood Thinner Instructions:na Aspirin Instructions:na  ERAS Protcol -clears until 1100 PRE-SURGERY Ensure or G2- G2  COVID TEST- na   Anesthesia review: yes cardiac clearance  Patient denies shortness of breath, fever, cough and chest pain at PAT appointment   All instructions explained to the patient, with a verbal understanding of the material. Patient agrees to go over the instructions while at home for a better understanding. Patient also instructed to wear a mask when out in public prior to surgery. The opportunity to ask questions was provided.

## 2023-06-05 NOTE — Progress Notes (Signed)
Anesthesia Chart Review:  Case: [id number] Date/Time: 06/19/23 1345   Procedure: ANTERIOR CERVICAL DECOMPRESSION FUSION CERVICAL 4- CERVICAL 5, CERVICAL 5- CERVICAL 6, CERVICAL 6- CERVICAL 7 WITH INSTRUMENTATION AND ALLOGRAFT   Anesthesia type: General   Pre-op diagnosis: CERVICAL MYELOPATHY   Location: [facility] ROOM 05 / [facility]   Surgeons: Estill Bamberg, MD       DISCUSSION: Patient is a 52 year old female scheduled for the above procedure.  History includes smoking, HTN, HLD, DM2, anemia, anxiety. BMI is consistent with obesity. No evidence of CAD on 12/09/22 Coronary CT.  She was established with cardiologist Dr. Servando Salina on 11/29/22 following an ED visit for chest pain. CCTA on 12/09/22 showed no evidence of CAD. Coronary calcium score of 0.  No further ischemic work-up recommended. She was started on low dose amlodipine for HTN. At 03/21/23 follow-up visit, she reported pending work-up with rheumatology for RA or lupus. She was agreeable to enrol in the African-American Heart Study. Preoperative cardiology input provided by Joni Reining, NP on 06/02/23, "Preoperative Cardiovascular Risk Assessment:   The patient was advised that if she develops new symptoms prior to surgery to contact our office to arrange for a follow-up visit, and she verbalized understanding.   According to the Revised Cardiac Risk Index (RCRI), her Perioperative Risk of Major Cardiac Event is (%): 0.4   Her Functional Capacity in METs is: 7.25 according to the Duke Activity Status Index (DASI).    Therefore, based on ACC/AHA guidelines, patient would be at acceptable risk for the planned procedure without further cardiovascular testing."  A1c 7.5% on 05/20/23 ([facility]). A1c is up from 6.1% on 11/29/22. She had been diet controlled up to this point, and is not on any diabetes medications. She will need CBG on arrival. She reported home readings ~ 125-130's.  Anesthesia team to evaluate on the day of surgery. She  is for T&S and urine pregnancy test on the day of surgery.    VS: LMP 01/10/2023 (Approximate)  Wt Readings from Last 3 Encounters:  06/04/23 109 kg  03/31/23 106.5 kg  03/21/23 105.7 kg   Temp Readings from Last 3 Encounters:  06/04/23 36.9 C (Oral)  11/07/22 36.8 C  10/05/21 36.7 C (Oral)   BP Readings from Last 3 Encounters:  06/04/23 120/68  03/31/23 (!) 147/91  03/21/23 132/74   Pulse Readings from Last 3 Encounters:  06/04/23 87  03/31/23 74  03/21/23 82     PROVIDERS: Norm Salt, PA is PCP  Francee Nodal, DO is cardiologist   LABS: Most recent labs results in [facility] include: Lab Results  Component Value Date   WBC 7.4 06/04/2023   HGB 12.9 06/04/2023   HCT 40.3 06/04/2023   PLT 363 06/04/2023   GLUCOSE 137 (H) 06/04/2023   CHOL 191 11/29/2022   TRIG 73 11/29/2022   HDL 49 11/29/2022   LDLCALC 129 (H) 11/29/2022   NA 138 06/04/2023   K 3.9 06/04/2023   CL 111 06/04/2023   CREATININE 0.64 06/04/2023   BUN 14 06/04/2023   CO2 22 06/04/2023   HGBA1C 6.1 (H) 11/29/2022  A1c 7.5%, AST 11, ALT 17 by 05/20/23 labs at [facility].    IMAGES: MRI C-spine 04/20/23: IMPRESSION: 1. Worsened severe spinal canal stenosis at C4-5 secondary to ossification of the posterior longitudinal ligament and superimposed central disc protrusion. Slightly worsened mass effect on the spinal cord 2. Unchanged moderate spinal canal stenosis at C5-6 and mild spinal canal  stenosis at C6-7. 3. Moderate right and mild left neural foraminal stenosis at C4-5.   CT Chest (over read CCTA) 12/09/22: FINDINGS: Heart is normal size. Aorta normal caliber. No adenopathy. No confluent airspace opacities or effusions. No acute findings in the upper abdomen. Chest wall soft tissues are unremarkable. No acute bony abnormality. IMPRESSION: No acute or significant extracardiac abnormality.   EKG: EKG 11/07/22: NSR   CV: CT Coronary 12/09/22: IMPRESSION: 1. No evidence  of CAD, CADRADS = 0. 2. Coronary calcium score of 0. This was 0 percentile for age and sex matched control. 3. Normal coronary origin with right dominance. INTERPRETATION: CAD-RADS 0: No evidence of CAD (0%). Consider non-atherosclerotic causes of chest pain.    Past Medical History:  Diagnosis Date   Anemia    Anxiety    Depression    Diabetes (HCC)    HLD (hyperlipidemia)    Hypertension     Past Surgical History:  Procedure Laterality Date   CESAREAN SECTION     TUBAL LIGATION      MEDICATIONS: No current facility-administered medications for this encounter.    acetaminophen (TYLENOL) 650 MG CR tablet   amLODipine (NORVASC) 2.5 MG tablet   gabapentin (NEURONTIN) 600 MG tablet   lidocaine (HM LIDOCAINE PATCH) 4 %   rosuvastatin (CRESTOR) 5 MG tablet    Shonna Chock, PA-C Surgical Short Stay/Anesthesiology [facility] Phone [phone] [facility] Phone [phone] 06/05/2023 5:03 PM

## 2023-06-05 NOTE — Anesthesia Preprocedure Evaluation (Addendum)
Anesthesia Evaluation  Patient identified by MRN, date of birth, ID band Patient awake    Reviewed: Allergy & Precautions, H&P , NPO status , Patient's Chart, lab work & pertinent test results  Airway Mallampati: II  TM Distance: >3 FB Neck ROM: Limited    Dental no notable dental hx. (+) Dental Advisory Given   Pulmonary neg pulmonary ROS, Current Smoker and Patient abstained from smoking.   Pulmonary exam normal breath sounds clear to auscultation       Cardiovascular hypertension, negative cardio ROS Normal cardiovascular exam Rhythm:Regular Rate:Normal     Neuro/Psych negative neurological ROS  negative psych ROS   GI/Hepatic negative GI ROS, Neg liver ROS,,,  Endo/Other  negative endocrine ROSdiabetes    Renal/GU negative Renal ROS  negative genitourinary   Musculoskeletal negative musculoskeletal ROS (+)    Abdominal   Peds negative pediatric ROS (+)  Hematology negative hematology ROS (+)   Anesthesia Other Findings   Reproductive/Obstetrics negative OB ROS                              Anesthesia Physical Anesthesia Plan  ASA: 3  Anesthesia Plan: General   Post-op Pain Management:    Induction: Intravenous  PONV Risk Score and Plan:   Airway Management Planned: Oral ETT  Additional Equipment:   Intra-op Plan:   Post-operative Plan: Extubation in OR  Informed Consent:   Plan Discussed with:   Anesthesia Plan Comments: (PAT note written 06/05/2023 by Shonna Chock, PA-C.  Patient is a 52 year old female scheduled for the above procedure.   History includes smoking, HTN, HLD, DM2, anemia, anxiety. BMI is consistent with obesity. No evidence of CAD on 12/09/22 Coronary CT.   She was established with cardiologist Dr. Servando Salina on 11/29/22 following an ED visit for chest pain. CCTA on 12/09/22 showed no evidence of CAD. Coronary calcium score of 0.  No further ischemic  work-up recommended. She was started on low dose amlodipine for HTN. At 03/21/23 follow-up visit, she reported pending work-up with rheumatology for RA or lupus. She was agreeable to enrol in the African-American Heart Study. Preoperative cardiology input provided by Joni Reining, NP on 06/02/23, "Preoperative Cardiovascular Risk Assessment:   )        Anesthesia Quick Evaluation

## 2023-06-09 ENCOUNTER — Ambulatory Visit: Payer: Medicaid Other | Admitting: Skilled Nursing Facility1

## 2023-06-19 ENCOUNTER — Ambulatory Visit (HOSPITAL_COMMUNITY): Payer: Managed Care, Other (non HMO)

## 2023-06-19 ENCOUNTER — Ambulatory Visit (HOSPITAL_BASED_OUTPATIENT_CLINIC_OR_DEPARTMENT_OTHER): Payer: Managed Care, Other (non HMO) | Admitting: Physician Assistant

## 2023-06-19 ENCOUNTER — Ambulatory Visit (HOSPITAL_COMMUNITY)
Admission: RE | Disposition: A | Discharge status: Home / Self Care | Payer: Self-pay | Source: Home / Self Care | Attending: Orthopedic Surgery

## 2023-06-19 ENCOUNTER — Other Ambulatory Visit: Payer: Self-pay

## 2023-06-19 ENCOUNTER — Other Ambulatory Visit (HOSPITAL_COMMUNITY): Payer: Self-pay

## 2023-06-19 ENCOUNTER — Encounter (HOSPITAL_COMMUNITY): Payer: Self-pay | Admitting: Orthopedic Surgery

## 2023-06-19 ENCOUNTER — Ambulatory Visit (HOSPITAL_COMMUNITY)
Admission: RE | Admit: 2023-06-19 | Discharge: 2023-06-19 | Disposition: A | Discharge status: Home / Self Care | Payer: Managed Care, Other (non HMO) | Attending: Orthopedic Surgery | Admitting: Orthopedic Surgery

## 2023-06-19 ENCOUNTER — Ambulatory Visit (HOSPITAL_COMMUNITY): Payer: Managed Care, Other (non HMO) | Admitting: Physician Assistant

## 2023-06-19 DIAGNOSIS — E785 Hyperlipidemia, unspecified: Secondary | ICD-10-CM | POA: Diagnosis not present

## 2023-06-19 DIAGNOSIS — I10 Essential (primary) hypertension: Secondary | ICD-10-CM

## 2023-06-19 DIAGNOSIS — M50021 Cervical disc disorder at C4-C5 level with myelopathy: Secondary | ICD-10-CM | POA: Insufficient documentation

## 2023-06-19 DIAGNOSIS — F17210 Nicotine dependence, cigarettes, uncomplicated: Secondary | ICD-10-CM | POA: Diagnosis not present

## 2023-06-19 DIAGNOSIS — M5010 Cervical disc disorder with radiculopathy, unspecified cervical region: Secondary | ICD-10-CM

## 2023-06-19 DIAGNOSIS — G9520 Unspecified cord compression: Secondary | ICD-10-CM | POA: Diagnosis not present

## 2023-06-19 DIAGNOSIS — M488X2 Other specified spondylopathies, cervical region: Secondary | ICD-10-CM

## 2023-06-19 DIAGNOSIS — Z79899 Other long term (current) drug therapy: Secondary | ICD-10-CM | POA: Diagnosis not present

## 2023-06-19 DIAGNOSIS — D649 Anemia, unspecified: Secondary | ICD-10-CM | POA: Insufficient documentation

## 2023-06-19 DIAGNOSIS — F419 Anxiety disorder, unspecified: Secondary | ICD-10-CM | POA: Insufficient documentation

## 2023-06-19 DIAGNOSIS — Z01818 Encounter for other preprocedural examination: Secondary | ICD-10-CM

## 2023-06-19 HISTORY — PX: ANTERIOR CERVICAL DECOMPRESSION/DISCECTOMY FUSION 4 LEVELS: SHX5556

## 2023-06-19 HISTORY — DX: Hyperlipidemia, unspecified: E78.5

## 2023-06-19 HISTORY — DX: Essential (primary) hypertension: I10

## 2023-06-19 LAB — TYPE AND SCREEN
ABO/RH(D): O POS
Antibody Screen: NEGATIVE

## 2023-06-19 LAB — POCT PREGNANCY, URINE: Preg Test, Ur: NEGATIVE

## 2023-06-19 LAB — GLUCOSE, CAPILLARY
Glucose-Capillary: 151 mg/dL — ABNORMAL HIGH (ref 70–99)
Glucose-Capillary: 111 mg/dL — ABNORMAL HIGH (ref 70–99)
Glucose-Capillary: 117 mg/dL — ABNORMAL HIGH (ref 70–99)

## 2023-06-19 SURGERY — ANTERIOR CERVICAL DECOMPRESSION/DISCECTOMY FUSION 4 LEVELS
Anesthesia: General

## 2023-06-19 MED ORDER — ONDANSETRON HCL 4 MG/2ML IJ SOLN
INTRAMUSCULAR | Status: DC | PRN
  Administered 2023-06-19: 4 mg via INTRAVENOUS

## 2023-06-19 MED ORDER — THROMBIN 20000 UNITS EX SOLR
CUTANEOUS | Status: DC | PRN
  Administered 2023-06-19: 20000 [IU] via TOPICAL

## 2023-06-19 MED ORDER — ACETAMINOPHEN 160 MG/5ML PO SOLN: 325.0000 mg | ORAL | Status: DC | PRN

## 2023-06-19 MED ORDER — FENTANYL CITRATE (PF) 100 MCG/2ML IJ SOLN
25.0000 ug | INTRAMUSCULAR | Status: DC | PRN
  Administered 2023-06-19: 50 ug via INTRAVENOUS
  Administered 2023-06-19 (×2): 25 ug via INTRAVENOUS

## 2023-06-19 MED ORDER — ONDANSETRON HCL 4 MG/2ML IJ SOLN: 4.0000 mg | Freq: Once | INTRAMUSCULAR | Status: DC | PRN

## 2023-06-19 MED ORDER — BUPIVACAINE-EPINEPHRINE 0.25% -1:200000 IJ SOLN
INTRAMUSCULAR | Status: DC | PRN
  Administered 2023-06-19: 5 mL

## 2023-06-19 MED ORDER — FENTANYL CITRATE (PF) 250 MCG/5ML IJ SOLN
INTRAMUSCULAR | Status: DC | PRN
  Administered 2023-06-19 (×3): 50 ug via INTRAVENOUS
  Administered 2023-06-19 (×2): 25 ug via INTRAVENOUS
  Administered 2023-06-19: 50 ug via INTRAVENOUS

## 2023-06-19 MED ORDER — LACTATED RINGERS IV SOLN: INTRAVENOUS | Status: DC | PRN

## 2023-06-19 MED ORDER — BUPIVACAINE-EPINEPHRINE (PF) 0.25% -1:200000 IJ SOLN
INTRAMUSCULAR | Status: AC
  Filled 2023-06-19: qty 30

## 2023-06-19 MED ORDER — OXYCODONE HCL 5 MG/5ML PO SOLN: 5.0000 mg | Freq: Once | ORAL | Status: DC | PRN

## 2023-06-19 MED ORDER — ACETAMINOPHEN 10 MG/ML IV SOLN
INTRAVENOUS | Status: AC
  Filled 2023-06-19: qty 100

## 2023-06-19 MED ORDER — ORAL CARE MOUTH RINSE: 15.0000 mL | Freq: Once | OROMUCOSAL | Status: AC

## 2023-06-19 MED ORDER — FENTANYL CITRATE (PF) 100 MCG/2ML IJ SOLN
INTRAMUSCULAR | Status: AC
  Filled 2023-06-19: qty 2

## 2023-06-19 MED ORDER — DEXMEDETOMIDINE HCL IN NACL 80 MCG/20ML IV SOLN
INTRAVENOUS | Status: DC | PRN
  Administered 2023-06-19: 8 ug via INTRAVENOUS
  Administered 2023-06-19: 4 ug via INTRAVENOUS
  Administered 2023-06-19: 8 ug via INTRAVENOUS

## 2023-06-19 MED ORDER — LACTATED RINGERS IV SOLN: INTRAVENOUS | Status: DC

## 2023-06-19 MED ORDER — TRAMADOL HCL 50 MG PO TABS
50.0000 mg | ORAL_TABLET | Freq: Four times a day (QID) | ORAL | 0 refills | Status: AC | PRN
  Filled 2023-06-19: qty 12, 2d supply, fill #0

## 2023-06-19 MED ORDER — PHENYLEPHRINE HCL-NACL 20-0.9 MG/250ML-% IV SOLN
INTRAVENOUS | Status: DC | PRN
  Administered 2023-06-19: 20 ug/min via INTRAVENOUS

## 2023-06-19 MED ORDER — SUGAMMADEX SODIUM 200 MG/2ML IV SOLN
INTRAVENOUS | Status: DC | PRN
  Administered 2023-06-19: 326.70000000000005 mg via INTRAVENOUS

## 2023-06-19 MED ORDER — MIDAZOLAM HCL 2 MG/2ML IJ SOLN
INTRAMUSCULAR | Status: AC
  Filled 2023-06-19: qty 2

## 2023-06-19 MED ORDER — CEFAZOLIN SODIUM-DEXTROSE 2-4 GM/100ML-% IV SOLN
2.0000 g | INTRAVENOUS | Status: AC
  Administered 2023-06-19: 2 g via INTRAVENOUS
  Filled 2023-06-19: qty 100

## 2023-06-19 MED ORDER — MEPERIDINE HCL 25 MG/ML IJ SOLN: 6.2500 mg | INTRAMUSCULAR | Status: DC | PRN

## 2023-06-19 MED ORDER — THROMBIN 20000 UNITS EX KIT
PACK | CUTANEOUS | Status: AC
  Filled 2023-06-19: qty 1

## 2023-06-19 MED ORDER — LIDOCAINE 2% (20 MG/ML) 5 ML SYRINGE
INTRAMUSCULAR | Status: DC | PRN
  Administered 2023-06-19: 100 mg via INTRAVENOUS

## 2023-06-19 MED ORDER — DIAZEPAM 5 MG PO TABS
5.0000 mg | ORAL_TABLET | Freq: Three times a day (TID) | ORAL | 0 refills | Status: AC | PRN
  Filled 2023-06-19: qty 60, 20d supply, fill #0

## 2023-06-19 MED ORDER — CHLORHEXIDINE GLUCONATE 0.12 % MT SOLN
15.0000 mL | Freq: Once | OROMUCOSAL | Status: AC
  Administered 2023-06-19: 15 mL via OROMUCOSAL
  Filled 2023-06-19: qty 15

## 2023-06-19 MED ORDER — INSULIN ASPART 100 UNIT/ML IJ SOLN
0.0000 [IU] | INTRAMUSCULAR | Status: DC | PRN
  Administered 2023-06-19: 2 [IU] via SUBCUTANEOUS

## 2023-06-19 MED ORDER — ROCURONIUM BROMIDE 10 MG/ML (PF) SYRINGE
PREFILLED_SYRINGE | INTRAVENOUS | Status: DC | PRN
  Administered 2023-06-19: 80 mg via INTRAVENOUS
  Administered 2023-06-19: 20 mg via INTRAVENOUS

## 2023-06-19 MED ORDER — ACETAMINOPHEN 10 MG/ML IV SOLN
INTRAVENOUS | Status: DC | PRN
  Administered 2023-06-19: 1000 mg via INTRAVENOUS

## 2023-06-19 MED ORDER — POVIDONE-IODINE 7.5 % EX SOLN
Freq: Once | CUTANEOUS | Status: DC
  Filled 2023-06-19: qty 118

## 2023-06-19 MED ORDER — OXYCODONE HCL 5 MG/5ML PO SOLN
ORAL | Status: AC
  Filled 2023-06-19: qty 5

## 2023-06-19 MED ORDER — FENTANYL CITRATE (PF) 250 MCG/5ML IJ SOLN
INTRAMUSCULAR | Status: AC
  Filled 2023-06-19: qty 5

## 2023-06-19 MED ORDER — MIDAZOLAM HCL 2 MG/2ML IJ SOLN
INTRAMUSCULAR | Status: DC | PRN
  Administered 2023-06-19: 2 mg via INTRAVENOUS

## 2023-06-19 MED ORDER — PROPOFOL 10 MG/ML IV BOLUS
INTRAVENOUS | Status: DC | PRN
  Administered 2023-06-19: 190 mg via INTRAVENOUS
  Administered 2023-06-19: 25 ug/kg/min via INTRAVENOUS

## 2023-06-19 MED ORDER — 0.9 % SODIUM CHLORIDE (POUR BTL) OPTIME
TOPICAL | Status: DC | PRN
  Administered 2023-06-19: 1000 mL

## 2023-06-19 MED ORDER — ACETAMINOPHEN 325 MG PO TABS: 325.0000 mg | ORAL_TABLET | ORAL | Status: DC | PRN

## 2023-06-19 MED ORDER — OXYCODONE HCL 5 MG PO TABS: 5.0000 mg | ORAL_TABLET | Freq: Once | ORAL | Status: DC | PRN

## 2023-06-19 SURGICAL SUPPLY — 75 items
AGENT HMST KT MTR STRL THRMB (HEMOSTASIS)
APL SKNCLS STERI-STRIP NONHPOA (GAUZE/BANDAGES/DRESSINGS) ×1
BAG COUNTER SPONGE SURGICOUNT (BAG) ×1 IMPLANT
BAG SPNG CNTER NS LX DISP (BAG) ×1
BENZOIN TINCTURE PRP APPL 2/3 (GAUZE/BANDAGES/DRESSINGS) ×1 IMPLANT
BIT DRILL NEURO 2X3.1 SFT TUCH (MISCELLANEOUS) ×1 IMPLANT
BIT DRILL SRG 14X2.2XFLT CHK (BIT) IMPLANT
BIT DRL SRG 14X2.2XFLT CHK (BIT) ×1
BLADE CLIPPER SURG (BLADE) ×1 IMPLANT
BLADE SURG 15 STRL LF DISP TIS (BLADE) ×1 IMPLANT
BLADE SURG 15 STRL SS (BLADE) ×1
BONE MATRIX VIVIGEN 5CC (Bone Implant) ×2 IMPLANT
CAGE NANOLOCK ENDOSKELETON 7 S (Cage) IMPLANT
CLSR STERI-STRIP ANTIMIC 1/2X4 (GAUZE/BANDAGES/DRESSINGS) IMPLANT
COVER SURGICAL LIGHT HANDLE (MISCELLANEOUS) ×1 IMPLANT
DEVICE ENDSKLTN IMPLANT SM 7MM (Cage) IMPLANT
DRAIN JACKSON RD 7FR 3/32 (WOUND CARE) IMPLANT
DRAIN RELI 100 BL SUC LF ST (DRAIN)
DRAPE C-ARM 42X72 X-RAY (DRAPES) ×1 IMPLANT
DRAPE POUCH INSTRU U-SHP 10X18 (DRAPES) ×1 IMPLANT
DRAPE SURG 17X23 STRL (DRAPES) ×3 IMPLANT
DRILL BIT SKYLINE 14MM (BIT) ×1
DRILL NEURO 2X3.1 SOFT TOUCH (MISCELLANEOUS) ×1
DURAPREP 26ML APPLICATOR (WOUND CARE) ×1 IMPLANT
ELECT COATED BLADE 2.86 ST (ELECTRODE) ×1 IMPLANT
ELECT REM PT RETURN 9FT ADLT (ELECTROSURGICAL) ×1
ELECTRODE REM PT RTRN 9FT ADLT (ELECTROSURGICAL) ×1 IMPLANT
ENDOSKELETON IMPLANT SM 7MM (Cage) ×1 IMPLANT
EVACUATOR SILICONE 100CC (DRAIN) IMPLANT
GAUZE 4X4 16PLY ~~LOC~~+RFID DBL (SPONGE) ×1 IMPLANT
GAUZE SPONGE 4X4 12PLY STRL (GAUZE/BANDAGES/DRESSINGS) ×1 IMPLANT
GLOVE BIO SURGEON STRL SZ 6.5 (GLOVE) ×1 IMPLANT
GLOVE BIO SURGEON STRL SZ8 (GLOVE) ×1 IMPLANT
GLOVE BIOGEL PI IND STRL 7.0 (GLOVE) ×2 IMPLANT
GLOVE BIOGEL PI IND STRL 8 (GLOVE) ×1 IMPLANT
GLOVE SURG ENC MOIS LTX SZ6.5 (GLOVE) ×1 IMPLANT
GOWN STRL REUS W/ TWL LRG LVL3 (GOWN DISPOSABLE) ×1 IMPLANT
GOWN STRL REUS W/ TWL XL LVL3 (GOWN DISPOSABLE) ×1 IMPLANT
GOWN STRL REUS W/TWL LRG LVL3 (GOWN DISPOSABLE) ×1
GOWN STRL REUS W/TWL XL LVL3 (GOWN DISPOSABLE) ×1
GRAFT BNE MATRIX VG 5 (Bone Implant) IMPLANT
IV CATH 14GX2 1/4 (CATHETERS) ×1 IMPLANT
KIT BASIN OR (CUSTOM PROCEDURE TRAY) ×1 IMPLANT
KIT TURNOVER KIT B (KITS) ×1 IMPLANT
MANIFOLD NEPTUNE II (INSTRUMENTS) IMPLANT
NDL PRECISIONGLIDE 27X1.5 (NEEDLE) ×1 IMPLANT
NDL SPNL 20GX3.5 QUINCKE YW (NEEDLE) ×1 IMPLANT
NEEDLE PRECISIONGLIDE 27X1.5 (NEEDLE) ×1
NEEDLE SPNL 20GX3.5 QUINCKE YW (NEEDLE) ×1
NS IRRIG 1000ML POUR BTL (IV SOLUTION) ×1 IMPLANT
PACK ORTHO CERVICAL (CUSTOM PROCEDURE TRAY) ×1 IMPLANT
PAD ARMBOARD 7.5X6 YLW CONV (MISCELLANEOUS) ×2 IMPLANT
PATTIES SURGICAL .5 X.5 (GAUZE/BANDAGES/DRESSINGS) IMPLANT
PATTIES SURGICAL .5 X1 (DISPOSABLE) IMPLANT
PIN DISTRACTION 14 (PIN) IMPLANT
PLATE SKYLINE 3 LVL 48MM (Plate) ×2 IMPLANT
POSITIONER HEAD DONUT 9IN (MISCELLANEOUS) ×1 IMPLANT
SCREW SKYLINE VAR OS 14MM (Screw) IMPLANT
SPIKE FLUID TRANSFER (MISCELLANEOUS) ×1 IMPLANT
SPONGE INTESTINAL PEANUT (DISPOSABLE) ×2 IMPLANT
SPONGE SURGIFOAM ABS GEL 100 (HEMOSTASIS) ×1 IMPLANT
STRIP CLOSURE SKIN 1/2X4 (GAUZE/BANDAGES/DRESSINGS) ×1 IMPLANT
SURGIFLO W/THROMBIN 8M KIT (HEMOSTASIS) IMPLANT
SUT MNCRL AB 4-0 PS2 18 (SUTURE) ×1 IMPLANT
SUT VIC AB 2-0 CT2 18 VCP726D (SUTURE) ×1 IMPLANT
SYR BULB IRRIG 60ML STRL (SYRINGE) ×1 IMPLANT
SYR CONTROL 10ML LL (SYRINGE) ×2 IMPLANT
TAPE CLOTH 4X10 WHT NS (GAUZE/BANDAGES/DRESSINGS) ×1 IMPLANT
TAPE CLOTH SURG 4X10 WHT LF (GAUZE/BANDAGES/DRESSINGS) IMPLANT
TAPE UMBILICAL 1/8X30 (MISCELLANEOUS) ×2 IMPLANT
TOWEL GREEN STERILE (TOWEL DISPOSABLE) ×1 IMPLANT
TOWEL GREEN STERILE FF (TOWEL DISPOSABLE) ×1 IMPLANT
TRAY FOLEY MTR SLVR 16FR STAT (SET/KITS/TRAYS/PACK) ×1 IMPLANT
WATER STERILE IRR 1000ML POUR (IV SOLUTION) ×1 IMPLANT
YANKAUER SUCT BULB TIP NO VENT (SUCTIONS) ×1 IMPLANT

## 2023-06-19 NOTE — Anesthesia Procedure Notes (Signed)
Procedure Name: Intubation Date/Time: 06/19/2023 2:22 PM  Performed by: Loleta Evy Lutterman, CRNAPre-anesthesia Checklist: Patient identified, Patient being monitored, Timeout performed, Emergency Drugs available and Suction available Patient Re-evaluated:Patient Re-evaluated prior to induction Oxygen Delivery Method: Circle system utilized Preoxygenation: Pre-oxygenation with 100% oxygen Induction Type: IV induction Ventilation: Mask ventilation without difficulty Laryngoscope Size: 3 and Glidescope Grade View: Grade I Tube type: Oral Tube size: 7.0 mm Number of attempts: 1 Airway Equipment and Method: Stylet Placement Confirmation: ETT inserted through vocal cords under direct vision, positive ETCO2 and breath sounds checked- equal and bilateral Secured at: 23 cm Tube secured with: Tape Dental Injury: Teeth and Oropharynx as per pre-operative assessment

## 2023-06-19 NOTE — Transfer of Care (Signed)
Immediate Anesthesia Transfer of Care Note  Patient: Gracieann Dollar-Bailey  Procedure(s) Performed: ANTERIOR CERVICAL DECOMPRESSION FUSION CERVICAL 4- CERVICAL 5, CERVICAL 5- CERVICAL 6, CERVICAL 6- CERVICAL 7 WITH INSTRUMENTATION AND ALLOGRAFT  Patient Location: PACU  Anesthesia Type:General  Level of Consciousness: responds to stimulation  Airway & Oxygen Therapy: Patient Spontanous Breathing and Patient connected to face mask oxygen  Post-op Assessment: Report given to RN and Post -op Vital signs reviewed and stable  Post vital signs: Reviewed and stable  Last Vitals:  Vitals Value Taken Time  BP 139/93 06/19/23 1645  Temp 37.2 C 06/19/23 1645  Pulse 96 06/19/23 1648  Resp 12 06/19/23 1648  SpO2 100 % 06/19/23 1648  Vitals shown include unfiled device data.  Last Pain:  Vitals:   06/19/23 1118  TempSrc:   PainSc: 6       Patients Stated Pain Goal: 0 (06/19/23 1118)  Complications: No notable events documented.

## 2023-06-19 NOTE — Anesthesia Postprocedure Evaluation (Signed)
Anesthesia Post Note  Patient: Stacy Cooper  Procedure(s) Performed: ANTERIOR CERVICAL DECOMPRESSION FUSION CERVICAL 4- CERVICAL 5, CERVICAL 5- CERVICAL 6, CERVICAL 6- CERVICAL 7 WITH INSTRUMENTATION AND ALLOGRAFT     Patient location during evaluation: PACU Anesthesia Type: General Level of consciousness: awake and alert Pain management: pain level controlled Vital Signs Assessment: post-procedure vital signs reviewed and stable Respiratory status: spontaneous breathing, nonlabored ventilation, respiratory function stable and patient connected to nasal cannula oxygen Cardiovascular status: blood pressure returned to baseline and stable Postop Assessment: no apparent nausea or vomiting Anesthetic complications: no  No notable events documented.  Last Vitals:  Vitals:   06/19/23 1700 06/19/23 1715  BP: (!) 129/90 (!) 133/90  Pulse: 88 90  Resp: 15 16  Temp:    SpO2: 98% 95%    Last Pain:  Vitals:   06/19/23 1715  TempSrc:   PainSc: 9                  Kohlton Gilpatrick S

## 2023-06-19 NOTE — H&P (Signed)
PREOPERATIVE H&P  Chief Complaint: Deterioration in balance and fine motor skills  HPI: Stacy Cooper is a 52 y.o. female who presents with   MRI reveals spinal cord compression due to prominent disc herniations and ossification of the posterior longitudinal ligament at C4-5, C5-6, and C6-7  Patient has failed multiple forms of conservative care and continues to have pain (see office notes for additional details regarding the patient's full course of treatment)  Past Medical History:  Diagnosis Date   Anemia    Anxiety    Depression    Diabetes (HCC)    HLD (hyperlipidemia)    Hypertension    Past Surgical History:  Procedure Laterality Date   CESAREAN SECTION     TUBAL LIGATION     Social History   Socioeconomic History   Marital status: Married    Spouse name: Not on file   Number of children: Not on file   Years of education: Not on file   Highest education level: Not on file  Occupational History   Not on file  Tobacco Use   Smoking status: Every Day    Current packs/day: 0.25    Types: Cigarettes   Smokeless tobacco: Never  Vaping Use   Vaping status: Never Used  Substance and Sexual Activity   Alcohol use: No   Drug use: No   Sexual activity: Yes    Birth control/protection: None, Surgical  Other Topics Concern   Not on file  Social History Narrative   Not on file   Social Determinants of Health   Financial Resource Strain: Medium Risk (10/17/2022)   Received [facility]   Overall Financial Resource Strain (CARDIA)    Difficulty of Paying Living Expenses: Somewhat hard  Food Insecurity: Food Insecurity Present (10/17/2022)   Received from [facility]   Hunger Vital Sign    Worried About Running Out of Food in the Last Year: Sometimes true    Ran Out of Food in the Last Year: Sometimes true  Transportation Needs: No Transportation Needs (10/17/2022)   Received from [facility] - Transportation    Lack of  Transportation (Medical): No    Lack of Transportation (Non-Medical): No  Physical Activity: Inactive (10/17/2022)   Received from [facility]   Exercise Vital Sign    Days of Exercise per Week: 0 days    Minutes of Exercise per Session: 0 min  Stress: Stress Concern Present (10/17/2022)   Received from [facility] - Occupational Stress Questionnaire    Feeling of Stress : Very much  Social Connections: Moderately Integrated (10/17/2022)   Received from [facility]   Social Network    How would you rate your social network (family, work, friends)?: Adequate participation with social networks   Family History  Problem Relation Age of Onset   Rheum arthritis Mother    Rheum arthritis Sister    Allergies  Allergen Reactions   Codeine Nausea And Vomiting   Shellfish Allergy Itching and Swelling   Prednisone Rash    Causes H/A   Prior to Admission medications   Medication Sig Start Date End Date Taking? Authorizing Provider  acetaminophen (TYLENOL) 650 MG CR tablet Take 1,300 mg by mouth every 8 (eight) hours as needed for pain.   Yes [provider]  amLODipine (NORVASC) 2.5 MG tablet Take 1 tablet (2.5 mg total) by mouth daily. 11/29/22  Yes Tobb, Kardie, DO  gabapentin (  NEURONTIN) 600 MG tablet Take 600 mg by mouth at bedtime as needed (as needed).   Yes [provider]  lidocaine (HM LIDOCAINE PATCH) 4 % Place 1 patch onto the skin daily.   Yes [provider]  rosuvastatin (CRESTOR) 5 MG tablet Take 1 tablet (5 mg total) by mouth daily. 12/05/22  Yes Tobb, Kardie, DO     All other systems have been reviewed and were otherwise negative with the exception of those mentioned in the HPI and as above.  Physical Exam: Vitals:   06/19/23 1054  BP: (!) 142/87  Pulse: 84  Resp: 16  Temp: 98.6 F (37 C)  SpO2: 100%    Body mass index is 38.74 kg/m.  General: Alert, no acute distress Cardiovascular: No  pedal edema Respiratory: No cyanosis, no use of accessory musculature Skin: No lesions in the area of chief complaint Neurologic: Sensation intact distally Psychiatric: Patient is competent for consent with normal mood and affect Lymphatic: No axillary or cervical lymphadenopathy   Assessment/Plan: CERVICAL MYELOPATHY Plan for Procedure(s): ANTERIOR CERVICAL DECOMPRESSION FUSION CERVICAL 4- CERVICAL 5, CERVICAL 5- CERVICAL 6, CERVICAL 6- CERVICAL 7 WITH INSTRUMENTATION AND ALLOGRAFT   Jackelyn Hoehn, MD 06/19/2023 12:37 PM

## 2023-06-19 NOTE — Op Note (Signed)
PATIENT NAME: Stacy Cooper   MEDICAL RECORD NO.:   [id number]    DATE OF BIRTH: 1971/06/22   DATE OF PROCEDURE: 06/19/2023                               OPERATIVE REPORT     PREOPERATIVE DIAGNOSES: 1.  Progressive cervical myelopathy 2.  Spinal cord compression spanning C4-C7 3.  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  Spinal cord compression spanning C4-C7 3.  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 Anterior cervical decompression and fusion C4/5, C5/6, C6/7 2. Placement of anterior instrumentation, C4-C7 3. Insertion of interbody device x 3 (Titan intervertebral spacers) 4. Intraoperative use of fluoroscopy 5. Use of morselized allograft - ViviGen   SURGEON:  Estill Bamberg, MD   ASSISTANT:  Jason Coop, PA-C.   ANESTHESIA:  General endotracheal anesthesia.   COMPLICATIONS:  None.   DISPOSITION:  Stable.   ESTIMATED BLOOD LOSS:  Minimal.   INDICATIONS FOR SURGERY:  Briefly, Stacy Cooper is a pleasant 52 y.o. year- old female, who did present to me with progressive deterioration in fine motor skills and balance, in addition to progressive weakness. The patient's MRI did reveal the findings noted above.  Given the patient's progressive symptoms, and MRI findings, we did discuss proceeding with the procedure noted above.  The patient was fully aware of the risks and limitations of surgery as outlined in my preoperative note.   OPERATIVE DETAILS:  On 06/19/2023, the patient was brought to surgery and general endotracheal anesthesia was administered.  The patient was placed supine on the hospital bed. The neck was gently extended.  All bony prominences were meticulously padded.  The neck was prepped and draped in the usual sterile fashion.  At this point, I did make a left-sided transverse incision.  The platysma was incised.  A Smith-Robinson approach was used and  the anterior spine was identified. A self-retaining retractor was placed.  I then subperiosteally exposed the vertebral bodies from C4-C7.  Caspar pins were then placed into the C6 and C7 vertebral bodies and distraction was applied.  A thorough and complete C6-7 intervertebral diskectomy was performed.  The posterior longitudinal ligament was identified and entered using a nerve hook.  I then used #1 followed by #2 Kerrison to perform a thorough and complete intervertebral diskectomy.  The spinal canal was thoroughly decompressed, as was the right and left neuroforamen.  The endplates were then prepared and the appropriate-sized intervertebral spacer was then packed with ViviGen and tamped into position in the usual fashion.  The lower Caspar pin was then removed and placed into the C5 vertebral body and once again, distraction was applied across the C5-6 intervertebral space.  I then again performed a thorough and complete diskectomy, thoroughly decompressing the spinal canal and bilateral neuroforamena.  After preparing the endplates, the appropriate-sized intervertebral spacer was packed with ViviGen and tamped into position.  The lower Caspar pin was then removed and placed into the C4 vertebral body and once again, distraction was applied across the C4-5 intervertebral space.  I then again performed a thorough and complete diskectomy, thoroughly decompressing the spinal canal and bilateral neuroforamena.  After preparing the endplates, the appropriate-sized intervertebral spacer was packed with ViviGen and tamped into position.  The Caspar pins then were removed and bone wax was placed in their place.  The appropriate-sized anterior cervical plate was  placed over the anterior spine.  14 mm variable angle screws were placed, 2 in each vertebral body from C4-C7 for a total of 8 vertebral body screws.  The screws were then locked to the plate using the Cam locking mechanism.  I was very  pleased with the final fluoroscopic images.  The wound was then irrigated.  The wound was then explored for any undue bleeding and there was no bleeding noted. The wound was then closed in layers using 2-0 Vicryl, followed by 4-0 Monocryl.  Benzoin and Steri-Strips were applied, followed by sterile dressing.  All instrument counts were correct at the termination of the procedure.   Of note, Jason Coop, PA-C, was my assistant throughout surgery, and did aid in retraction, suctioning, placement of the hardware, and closure from start to finish.       Estill Bamberg, MD

## 2023-06-20 MED FILL — Thrombin For Soln Kit 20000 Unit: CUTANEOUS | Qty: 1 | Status: AC

## 2023-06-26 ENCOUNTER — Encounter (HOSPITAL_COMMUNITY): Payer: Self-pay | Admitting: Orthopedic Surgery

## 2023-06-27 ENCOUNTER — Encounter (HOSPITAL_COMMUNITY): Payer: Self-pay | Admitting: Orthopedic Surgery

## 2023-07-08 ENCOUNTER — Encounter (HOSPITAL_COMMUNITY): Payer: Self-pay | Admitting: Orthopedic Surgery

## 2023-07-08 NOTE — OR Nursing (Signed)
Late entry. Forgot to put in on day of surgery.

## 2023-07-09 ENCOUNTER — Encounter (HOSPITAL_COMMUNITY): Payer: Self-pay | Admitting: Orthopedic Surgery

## 2023-08-18 ENCOUNTER — Ambulatory Visit: Payer: Managed Care, Other (non HMO) | Attending: Internal Medicine | Admitting: Internal Medicine

## 2023-08-18 ENCOUNTER — Encounter: Payer: Self-pay | Admitting: Internal Medicine

## 2023-08-18 VITALS — BP 117/75 | HR 87 | Resp 16 | Ht 65.0 in | Wt 246.0 lb

## 2023-08-18 DIAGNOSIS — R768 Other specified abnormal immunological findings in serum: Secondary | ICD-10-CM | POA: Diagnosis not present

## 2023-08-18 DIAGNOSIS — M7989 Other specified soft tissue disorders: Secondary | ICD-10-CM | POA: Insufficient documentation

## 2023-08-18 DIAGNOSIS — M7918 Myalgia, other site: Secondary | ICD-10-CM | POA: Insufficient documentation

## 2023-08-18 NOTE — Progress Notes (Signed)
Office Visit Note  Patient: Stacy Cooper             Date of Birth: 1971-05-19           MRN: [id number]             PCP: Norm Salt, PA Referring: Norm Salt, PA Visit Date: 08/18/2023   Subjective:  New Patient (Initial Visit) (Bil shoulders, bil arms, bil legs,  bil knees, low back pain, bil feet swelling)   History of Present Illness: Stacy Cooper is a 52 y.o. female here for evaluation of positive ANA checked associated with chronic pain in multiple areas.  She reports symptoms going back more than a decade with chronic pain in multiple areas including her neck her arms her low back and in both legs.  Not associated with specific injury or triggering medical events that she can recall.  She does not see much visible joint swelling redness or warmth to the touch.  Has had prolonged morning stiffness lasting an hour on many days.  She takes gabapentin and Tylenol these are partially helpful.  She had recent neck surgery with fusion of 4 levels on August 8 this year.  She still in the process of entirely recovering from this and has a weight lifting restriction and at the moment feels an increase in muscle stiffness around the neck and upper back. Besides the widespread joint and muscle pains she does not notice any new rashes, lymph node swelling, oral nasal ulcers, or discoloration in fingers and toes.   Labs reviewed ANA 1:320 speckled   Activities of Daily Living:  Patient reports morning stiffness for 1 hour.   Patient Reports nocturnal pain.  Difficulty dressing/grooming: Reports Difficulty climbing stairs: Reports Difficulty getting out of chair: Reports Difficulty using hands for taps, buttons, cutlery, and/or writing: Reports  Review of Systems  Constitutional:  Negative for fatigue.  HENT:  Positive for mouth dryness. Negative for mouth sores.   Eyes:  Negative for dryness.  Respiratory:  Negative for shortness of breath.    Cardiovascular:  Positive for palpitations. Negative for chest pain.  Gastrointestinal:  Negative for blood in stool, constipation and diarrhea.  Endocrine: Negative for increased urination.  Genitourinary:  Positive for involuntary urination.  Musculoskeletal:  Positive for joint pain, gait problem, joint pain, joint swelling, myalgias, muscle weakness, morning stiffness, muscle tenderness and myalgias.  Skin:  Positive for sensitivity to sunlight. Negative for color change, rash and hair loss.  Allergic/Immunologic: Negative for susceptible to infections.  Neurological:  Positive for dizziness and headaches.  Hematological:  Negative for swollen glands.  Psychiatric/Behavioral:  Positive for depressed mood and sleep disturbance. The patient is nervous/anxious.     PMFS History:  Patient Active Problem List   Diagnosis Date Noted   Positive ANA (antinuclear antibody) 08/18/2023   Myofascial pain 08/18/2023   Leg swelling 08/18/2023   Bipolar 1 disorder with moderate mania (HCC) 03/31/2023   History of posttraumatic stress disorder (PTSD) 03/31/2023   Positive screening for depression on 9-item Patient Health Questionnaire (PHQ-9) 03/31/2023   Smoker 11/30/2022   Primary hypertension 11/30/2022   Screening for hyperlipidemia 11/30/2022   Severe major depression, single episode, without psychotic features (HCC) 11/22/2018    Past Medical History:  Diagnosis Date   Anemia    Anxiety    Bipolar affective disorder, manic (HCC)    Depression    Diabetes (HCC)    HLD (hyperlipidemia)    Hypertension    PTSD (  post-traumatic stress disorder)     Family History  Problem Relation Age of Onset   Diabetes Mother    Rheum arthritis Mother    Hypertension Mother    Diabetes Sister    Hypertension Sister    Rheum arthritis Sister    Past Surgical History:  Procedure Laterality Date   ANTERIOR CERVICAL DECOMPRESSION/DISCECTOMY FUSION 4 LEVELS N/A 06/19/2023   Procedure: ANTERIOR  CERVICAL DECOMPRESSION FUSION CERVICAL 4- CERVICAL 5, CERVICAL 5- CERVICAL 6, CERVICAL 6- CERVICAL 7 WITH INSTRUMENTATION AND ALLOGRAFT;  Surgeon: Estill Bamberg, MD;  Location: [facility] OR;  Service: Orthopedics;  Laterality: N/A;   CESAREAN SECTION     TUBAL LIGATION     Social History   Social History Narrative   Not on file   There is no immunization history for the selected administration types on file for this patient.   Objective: Vital Signs: BP 117/75 (BP Location: Right Arm, Patient Position: Sitting, Cuff Size: Normal)   Pulse 87   Resp 16   Ht 5\' 5"  (1.651 m)   Wt 246 lb (111.6 kg)   BMI 40.94 kg/m    Physical Exam Constitutional:      Appearance: She is obese.  HENT:     Mouth/Throat:     Mouth: Mucous membranes are moist.     Pharynx: Oropharynx is clear.  Eyes:     Conjunctiva/sclera: Conjunctivae normal.  Cardiovascular:     Rate and Rhythm: Normal rate and regular rhythm.  Pulmonary:     Effort: Pulmonary effort is normal.     Breath sounds: Normal breath sounds.  Musculoskeletal:     Comments: 1+ pitting edema both legs  Lymphadenopathy:     Cervical: No cervical adenopathy.  Skin:    General: Skin is warm and dry.     Findings: No rash.  Neurological:     Mental Status: She is alert.  Psychiatric:     Comments: Flat affect      Musculoskeletal Exam:  Neck very tender to pressure, limited exam Shoulders and upper arms widespread tenderness to pressure no palpable swelling, guarding against overhead abduction range of motion but passive mobility intact Widespread upper and lower back paraspinal muscle tenderness to pressure Elbows full ROM no tenderness or swelling, right elbow popping with full extension Wrists full ROM no tenderness or swelling Fingers full ROM no tenderness or swelling Lateral hip tenderness on both sides, internal and external rotation is grossly intact Knees full ROM no tenderness or swelling Ankles full ROM no tenderness or  swelling   Investigation: No additional findings.  Imaging: No results found.  Recent Labs: Lab Results  Component Value Date   WBC 7.4 06/04/2023   HGB 12.9 06/04/2023   PLT 363 06/04/2023   NA 138 06/04/2023   K 3.9 06/04/2023   CL 111 06/04/2023   CO2 22 06/04/2023   GLUCOSE 137 (H) 06/04/2023   BUN 14 06/04/2023   CREATININE 0.64 06/04/2023   BILITOT 0.3 11/22/2018   ALKPHOS 67 11/22/2018   AST 29 11/22/2018   ALT 31 11/22/2018   PROT 7.3 11/22/2018   ALBUMIN 3.7 11/22/2018   CALCIUM 9.1 06/04/2023   GFRAA >60 11/22/2018    Speciality Comments: No specialty comments available.  Procedures:  No procedures performed Allergies: Shellfish allergy, Codeine, and Prednisone   Assessment / Plan:     Visit Diagnoses: Positive ANA (antinuclear antibody) - Plan: Sedimentation rate, RNP Antibody, Anti-Smith antibody, Sjogrens syndrome-A extractable nuclear antibody, Sjogrens syndrome-B extractable nuclear  antibody, C3 and C4, Protein / creatinine ratio, urine  Positive ANA at moderately high titer with widespread body pains otherwise history and exam is all nonspecific.  Will check serum inflammatory markers and ANA tests and serum complements.  Also screening for urine protein creatinine ratio given edema.  I have a lower pretest suspicion for this clinically.  Myofascial pain  Exam is very consistent for fibromyalgia and myofascial pain syndrome.  Tenderness to pressure over both joints and muscles and a pretty widespread upper and lower body worst in proximal distribution and without obvious structural changes.  Also describes associated problems with fatigue concentration difficulty dizziness recurrent headaches.  She reports very poor sleep quality attributes this to pain which could also be a contributing factor.  Provided link for her to check out online fibromyalgia patient self-management guide.  If workup is nonspecific may benefit to see a rehab or pain management  specialist for this as well.  Leg swelling  Pitting edema in both legs which she reports is a new symptom.  Initially questioned if this could be related to recent surgical procedure but on review that was 2 months ago which would be unusual.  She is on gabapentin and amlodipine but very low dose.  There is no evidence of associated dermatitis.  Will check urine protein creatinine ratio for screening.  If results are nonspecific but symptoms continue bothering her could benefit with study for possible venous insufficiency.  Orders: Orders Placed This Encounter  Procedures   Sedimentation rate   RNP Antibody   Anti-Smith antibody   Sjogrens syndrome-A extractable nuclear antibody   Sjogrens syndrome-B extractable nuclear antibody   C3 and C4   Protein / creatinine ratio, urine   No orders of the defined types were placed in this encounter.    Follow-Up Instructions: No follow-ups on file.   Fuller Plan, MD  Note - This record has been created using [employer].  Chart creation errors have been sought, but may not always  have been located. Such creation errors do not reflect on  the standard of medical care.

## 2023-08-18 NOTE — Patient Instructions (Signed)
I recommend checking out the [place] [facility] [place] patient-centered guide for fibromyalgia and chronic pain management: [email or web address]-gard[facility].[email or web address]/  I am checking lab tests to evaluate the positive ANA test along with pain in multiple areas.

## 2023-08-19 LAB — SJOGRENS SYNDROME-B EXTRACTABLE NUCLEAR ANTIBODY: SSB (La) (ENA) Antibody, IgG: <1 AI

## 2023-08-19 LAB — ANTI-SMITH ANTIBODY: ENA SM Ab Ser-aCnc: <1 AI

## 2023-08-19 LAB — C3 AND C4
C3 Complement: 170 mg/dL (ref 83–193)
C4 Complement: 43 mg/dL (ref 15–57)

## 2023-08-19 LAB — PROTEIN / CREATININE RATIO, URINE
Creatinine, Urine: 129 mg/dL (ref 20–275)
Protein/Creat Ratio: 62 mg/g{creat} (ref 24–184)
Protein/Creatinine Ratio: 0.062 mg/mg{creat} (ref 0.024–0.184)
Total Protein, Urine: 8 mg/dL (ref 5–24)

## 2023-08-19 LAB — SJOGRENS SYNDROME-A EXTRACTABLE NUCLEAR ANTIBODY: SSA (Ro) (ENA) Antibody, IgG: <1 AI

## 2023-08-19 LAB — SEDIMENTATION RATE: Sed Rate: 19 mm/h (ref 0–20)

## 2023-08-19 LAB — RNP ANTIBODY: Ribonucleic Protein(ENA) Antibody, IgG: <1 AI

## 2023-11-16 ENCOUNTER — Other Ambulatory Visit: Payer: Self-pay | Admitting: Cardiology

## 2023-12-16 ENCOUNTER — Other Ambulatory Visit: Payer: Self-pay | Admitting: Cardiology

## 2024-01-20 ENCOUNTER — Other Ambulatory Visit: Payer: Self-pay | Admitting: Cardiology

## 2024-02-18 ENCOUNTER — Other Ambulatory Visit: Payer: Self-pay | Admitting: Cardiology

## 2024-06-07 ENCOUNTER — Other Ambulatory Visit: Payer: Self-pay | Admitting: Cardiology

## 2024-06-15 ENCOUNTER — Other Ambulatory Visit: Payer: Self-pay

## 2024-06-15 MED ORDER — ROSUVASTATIN CALCIUM 5 MG PO TABS: 5.0000 mg | ORAL_TABLET | Freq: Every day | ORAL | 0 refills | Status: AC

## 2024-09-01 ENCOUNTER — Encounter: Payer: Self-pay | Admitting: Cardiology

## 2024-09-01 ENCOUNTER — Ambulatory Visit: Attending: Cardiology | Admitting: Cardiology

## 2024-09-01 VITALS — BP 158/100 | HR 77 | Ht 65.0 in | Wt 246.4 lb

## 2024-09-01 DIAGNOSIS — I10 Essential (primary) hypertension: Secondary | ICD-10-CM | POA: Diagnosis not present

## 2024-09-01 DIAGNOSIS — Z720 Tobacco use: Secondary | ICD-10-CM

## 2024-09-01 MED ORDER — AMLODIPINE BESYLATE 2.5 MG PO TABS: 2.5000 mg | ORAL_TABLET | Freq: Every day | ORAL | 3 refills | Status: DC

## 2024-09-01 NOTE — Progress Notes (Unsigned)
 Stacy Cooper

## 2024-09-01 NOTE — Patient Instructions (Addendum)
 Medication Instructions:  Your physician recommends that you continue on your current medications as directed. Please refer to the Current Medication list given to you today.  *If you need a refill on your cardiac medications before your next appointment, please call your pharmacy*  Follow-Up: At [facility], you and your health needs are our priority.  As part of our continuing mission to provide you with exceptional heart care, our providers are all part of one team.  This team includes your primary Cardiologist (physician) and Advanced Practice Providers or APPs (Physician Assistants and Nurse Practitioners) who all work together to provide you with the care you need, when you need it.  Your next appointment:   9 month(s)  Provider:   Kardie Tobb, DO    Please see pharm-D in 2 weeks.    Other Instructions:

## 2024-09-07 ENCOUNTER — Encounter: Payer: Self-pay | Admitting: *Deleted

## 2024-09-09 ENCOUNTER — Ambulatory Visit: Attending: Cardiology | Admitting: Cardiology

## 2024-09-09 ENCOUNTER — Encounter: Payer: Self-pay | Admitting: Cardiology

## 2024-09-09 VITALS — BP 110/80 | HR 88 | Ht 65.0 in | Wt 248.9 lb

## 2024-09-09 DIAGNOSIS — Z720 Tobacco use: Secondary | ICD-10-CM

## 2024-09-09 DIAGNOSIS — I10 Essential (primary) hypertension: Secondary | ICD-10-CM | POA: Diagnosis not present
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
## 2024-09-09 NOTE — Patient Instructions (Signed)
 Medication Instructions:  Your physician recommends that you continue on your current medications as directed. Please refer to the Current Medication list given to you today.  *If you need a refill on your cardiac medications before your next appointment, please call your pharmacy*  Follow-Up: At [facility], you and your health needs are our priority.  As part of our continuing mission to provide you with exceptional heart care, our providers are all part of one team.  This team includes your primary Cardiologist (physician) and Advanced Practice Providers or APPs (Physician Assistants and Nurse Practitioners) who all work together to provide you with the care you need, when you need it.  Your next appointment:   6 month(s)  Provider:   Kardie Tobb, DO   Other Instructions:

## 2024-10-27 ENCOUNTER — Other Ambulatory Visit: Payer: Self-pay | Admitting: Orthopedic Surgery

## 2024-10-27 DIAGNOSIS — Z981 Arthrodesis status: Secondary | ICD-10-CM

## 2024-10-29 ENCOUNTER — Ambulatory Visit
Admission: RE | Admit: 2024-10-29 | Discharge: 2024-10-29 | Disposition: A | Discharge status: Home / Self Care | Source: Ambulatory Visit | Attending: Orthopedic Surgery | Admitting: Orthopedic Surgery

## 2024-10-29 DIAGNOSIS — Z981 Arthrodesis status: Secondary | ICD-10-CM

## 2024-11-25 ENCOUNTER — Other Ambulatory Visit: Payer: Self-pay | Admitting: Orthopedic Surgery

## 2024-11-25 DIAGNOSIS — M542 Cervicalgia: Secondary | ICD-10-CM
